# Patient Record
Sex: Female | Born: 1965 | Race: Black or African American | Hispanic: No | Marital: Married | Smoking: Never smoker
Health system: Southern US, Community
[De-identification: ages and names within clinical notes are randomized; demographics above are authoritative.]

## PROBLEM LIST (undated history)

## (undated) DIAGNOSIS — K649 Unspecified hemorrhoids: Secondary | ICD-10-CM

## (undated) DIAGNOSIS — M1990 Unspecified osteoarthritis, unspecified site: Secondary | ICD-10-CM

## (undated) DIAGNOSIS — E785 Hyperlipidemia, unspecified: Secondary | ICD-10-CM

## (undated) DIAGNOSIS — T7840XA Allergy, unspecified, initial encounter: Secondary | ICD-10-CM

## (undated) HISTORY — DX: Allergy, unspecified, initial encounter: T78.40XA

## (undated) HISTORY — DX: Unspecified osteoarthritis, unspecified site: M19.90

## (undated) HISTORY — DX: Hyperlipidemia, unspecified: E78.5

## (undated) HISTORY — DX: Unspecified hemorrhoids: K64.9

---

## 2005-11-18 HISTORY — PX: BREAST SURGERY: SHX581

## 2006-11-18 HISTORY — PX: KNEE SURGERY: SHX244

## 2015-09-28 ENCOUNTER — Encounter: Payer: Self-pay | Admitting: Internal Medicine

## 2015-09-28 ENCOUNTER — Ambulatory Visit (INDEPENDENT_AMBULATORY_CARE_PROVIDER_SITE_OTHER): Payer: BLUE CROSS/BLUE SHIELD | Admitting: Internal Medicine

## 2015-09-28 VITALS — BP 116/78 | HR 82 | Temp 98.1°F | Ht 63.33 in | Wt 221.0 lb

## 2015-09-28 DIAGNOSIS — E785 Hyperlipidemia, unspecified: Secondary | ICD-10-CM | POA: Diagnosis not present

## 2015-09-28 DIAGNOSIS — R748 Abnormal levels of other serum enzymes: Secondary | ICD-10-CM | POA: Diagnosis not present

## 2015-09-28 DIAGNOSIS — Z23 Encounter for immunization: Secondary | ICD-10-CM | POA: Diagnosis not present

## 2015-09-28 DIAGNOSIS — R7989 Other specified abnormal findings of blood chemistry: Secondary | ICD-10-CM

## 2015-09-28 DIAGNOSIS — R7303 Prediabetes: Secondary | ICD-10-CM | POA: Diagnosis not present

## 2015-09-28 LAB — LIPID PANEL
CHOL/HDL RATIO: 5
CHOLESTEROL: 207 mg/dL — AB (ref 0–200)
HDL: 45.3 mg/dL (ref >39.00)
LDL CALC: 152 mg/dL — AB (ref 0–99)
NonHDL: 162.05
TRIGLYCERIDES: 49 mg/dL (ref 0.0–149.0)
VLDL: 9.8 mg/dL (ref 0.0–40.0)

## 2015-09-28 LAB — BASIC METABOLIC PANEL
BUN: 13 mg/dL (ref 6–23)
CHLORIDE: 105 meq/L (ref 96–112)
CO2: 26 meq/L (ref 19–32)
Calcium: 9.4 mg/dL (ref 8.4–10.5)
Creatinine, Ser: 1.07 mg/dL (ref 0.40–1.20)
GFR: 69.88 mL/min (ref >60.00)
GLUCOSE: 94 mg/dL (ref 70–99)
POTASSIUM: 4.1 meq/L (ref 3.5–5.1)
SODIUM: 138 meq/L (ref 135–145)

## 2015-09-28 LAB — HEMOGLOBIN A1C: Hgb A1c MFr Bld: 5.7 % (ref 4.6–6.5)

## 2015-09-28 NOTE — Assessment & Plan Note (Signed)
CMET and Lipid Profile today Discussed low fat diet She wants to try low fat diet and exercise before starting statin therapy

## 2015-09-28 NOTE — Addendum Note (Signed)
Addended by: Roena MaladyEVONTENNO, Omaya Nieland Y on: 09/28/2015 04:13 PM   Modules accepted: Orders

## 2015-09-28 NOTE — Progress Notes (Signed)
Pre visit review using our clinic review tool, if applicable. No additional management support is needed unless otherwise documented below in the visit note. 

## 2015-09-28 NOTE — Patient Instructions (Signed)

## 2015-09-28 NOTE — Assessment & Plan Note (Signed)
She will continue seeing a nutritionist Continue Adipex

## 2015-09-28 NOTE — Progress Notes (Signed)
HPI  Pt presents to the clinic today to establish care and for management of the conditions listed below. She has not had a PCP in many years but she has been seeing her GYN.  Flu: 07/2014, wants one today Tetanus: 2008 Pap Smear: 09/2015 Mammogram: 04/2015 Vision Screening: yearly Dentist: yearly  HLD: Her LDL was 161 on her most recent labs, 04/2015. She has been trying to consume a low fat diet. She is not on any cholesterol lowering medication.  Obesity: She is working with a Health and safety inspector. She is on Adipex but reports she has gained 16 lbs over the 3 months.   She does tell me that her creatinine was elevated on her most recent labs 04/2015. It was 1.18. She has no history of renal disease. She does not consume a lot of Ibuprofen.  Prediabetes: Her last A1C was 04/2015- she does not remember the value. She reports she was on Metformin but stopped it due upset stomach. Her brother had Type 1 DM. She denies increased thirst, frequent urination, blurred vision, or numbness and tingling in her hands and feet.  Past Medical History  Diagnosis Date  . Arthritis     Current Outpatient Prescriptions  Medication Sig Dispense Refill  . ibuprofen (ADVIL,MOTRIN) 800 MG tablet Take 1 tablet by mouth every 8 (eight) hours as needed.  0   No current facility-administered medications for this visit.    Allergies  Allergen Reactions  . Biaxin [Clarithromycin] Other (See Comments)    Severe abdominal cramping  . Penicillins Hives    Family History  Problem Relation Age of Onset  . Arthritis Mother   . Hyperlipidemia Mother   . Hypertension Mother   . Heart disease Father   . Breast cancer Paternal Aunt     Social History   Social History  . Marital Status: Married    Spouse Name: N/A  . Number of Children: N/A  . Years of Education: N/A   Occupational History  . Not on file.   Social History Main Topics  . Smoking status: Never Smoker   . Smokeless tobacco: Never Used  .  Alcohol Use: 0.0 oz/week    0 Standard drinks or equivalent per week     Comment: occasional  . Drug Use: Not on file  . Sexual Activity: Not on file   Other Topics Concern  . Not on file   Social History Narrative  . No narrative on file    ROS:  Constitutional: Pt reports weight gain. Denies fever, malaise, fatigue, headache or .  HEENT: Denies eye pain, eye redness, ear pain, ringing in the ears, wax buildup, runny nose, nasal congestion, bloody nose, or sore throat. Respiratory: Denies difficulty breathing, shortness of breath, cough or sputum production.   Cardiovascular: Denies chest pain, chest tightness, palpitations or swelling in the hands or feet.  Gastrointestinal: Denies abdominal pain, bloating, constipation, diarrhea or blood in the stool.  GU: Denies frequency, urgency, pain with urination, blood in urine, odor or discharge. Skin: Denies redness, rashes, lesions or ulcercations.  Neurological: Denies dizziness, difficulty with memory, difficulty with speech or problems with balance and coordination.  Psych: Denies anxiety, depression, SI/HI.  No other specific complaints in a complete review of systems (except as listed in HPI above).  PE:  BP 116/78 mmHg  Pulse 82  Temp(Src) 98.1 F (36.7 C) (Oral)  Ht 5' 3.33" (1.609 m)  Wt 221 lb (100.245 kg)  BMI 38.72 kg/m2  SpO2 99%  LMP 09/11/2015  Wt Readings from Last 3 Encounters:  09/28/15 221 lb (100.245 kg)    General: Appears her stated age, obese in NAD. Skin: Warm, dry and intact. No rashes, lesion or ulcerations noted. HEENT: Head: normal shape and size; Eyes: sclera white, no icterus, conjunctiva pink, PERRLA and EOMs intact;  Cardiovascular: Normal rate and rhythm. S1,S2 noted.  No murmur, rubs or gallops noted.  Pulmonary/Chest: Normal effort and positive vesicular breath sounds. No respiratory distress. No wheezes, rales or ronchi noted. Abdomen: Soft, nontender.  Neurological: Alert and oriented.   Psychiatric: Mood and affect normal. Behavior is normal. Judgment and thought content normal.     Assessment and Plan:  Elevated creatinine:  BMET today  RTC in 1 year or sooner if needed

## 2015-09-28 NOTE — Assessment & Plan Note (Signed)
A1C today Discussed low carb diet and exercise Flu shot today

## 2016-01-31 ENCOUNTER — Ambulatory Visit (INDEPENDENT_AMBULATORY_CARE_PROVIDER_SITE_OTHER): Payer: BLUE CROSS/BLUE SHIELD | Admitting: Internal Medicine

## 2016-01-31 ENCOUNTER — Encounter: Payer: Self-pay | Admitting: Internal Medicine

## 2016-01-31 VITALS — BP 118/76 | HR 79 | Temp 98.2°F | Wt 214.0 lb

## 2016-01-31 DIAGNOSIS — R05 Cough: Secondary | ICD-10-CM

## 2016-01-31 DIAGNOSIS — R058 Other specified cough: Secondary | ICD-10-CM

## 2016-01-31 MED ORDER — BENZONATATE 200 MG PO CAPS
200.0000 mg | ORAL_CAPSULE | Freq: Two times a day (BID) | ORAL | Status: DC | PRN
Start: 2016-01-31 — End: 2016-05-16

## 2016-01-31 MED ORDER — HYDROCOD POLST-CPM POLST ER 10-8 MG/5ML PO SUER: 5.0000 mL | Freq: Every evening | ORAL | Status: DC | PRN

## 2016-01-31 NOTE — Progress Notes (Signed)
HPI  Pt presents to the clinic today with c/o cough. This started 1 week ago. The cough is non productive but seems worse at night. It started out with nasal and chest congestion but that has improved. She denies fever, chills or body aches. She has tried Catering managerAlka Seltzer and cough syrup without much relief. She reports her son was diagnosed with the flu. She did get her flu shot.  Review of Systems      Past Medical History  Diagnosis Date  . Arthritis     Family History  Problem Relation Age of Onset  . Arthritis Mother   . Hyperlipidemia Mother   . Hypertension Mother   . Heart disease Father   . Breast cancer Paternal Aunt     Social History   Social History  . Marital Status: Married    Spouse Name: N/A  . Number of Children: N/A  . Years of Education: N/A   Occupational History  . Not on file.   Social History Main Topics  . Smoking status: Never Smoker   . Smokeless tobacco: Never Used  . Alcohol Use: 0.0 oz/week    0 Standard drinks or equivalent per week     Comment: occasional  . Drug Use: No  . Sexual Activity: Yes    Birth Control/ Protection: Surgical   Other Topics Concern  . Not on file   Social History Narrative    Allergies  Allergen Reactions  . Biaxin [Clarithromycin] Other (See Comments)    Severe abdominal cramping  . Penicillins Hives     Constitutional: Denies headache, fatigue, fever or  abrupt weight changes.  HEENT:  Denies eye redness, eye pain, pressure behind the eyes, facial pain, nasal congestion, ear pain, ringing in the ears, wax buildup, runny nose or sore throat. Respiratory: Positive cough. Denies difficulty breathing or shortness of breath.  Cardiovascular: Denies chest pain, chest tightness, palpitations or swelling in the hands or feet.   No other specific complaints in a complete review of systems (except as listed in HPI above).  Objective:   BP 118/76 mmHg  Pulse 79  Temp(Src) 98.2 F (36.8 C) (Oral)  Wt 214  lb (97.07 kg)  SpO2 98% Wt Readings from Last 3 Encounters:  01/31/16 214 lb (97.07 kg)  09/28/15 221 lb (100.245 kg)     General: Appears hers stated age, well developed, well nourished in NAD. HEENT: Head: normal shape and size, no sinus tenderness noted; Eyes: sclera white, no icterus, conjunctiva pink; Ears: Tm's gray and intact, normal light reflex; Nose: mucosa pink and moist, septum midline; Throat/Mouth: Teeth present, mucosa erythematous and moist, no exudate noted, no lesions or ulcerations noted.  Neck: No cervical lymphadenopathy.  Cardiovascular: Normal rate and rhythm. S1,S2 noted.  No murmur, rubs or gallops noted.  Pulmonary/Chest: Normal effort and positive vesicular breath sounds. No respiratory distress. No wheezes, rales or ronchi noted.      Assessment & Plan:   Post viral cough:  No indication for abx at this time No s/s of flu eRx for Tessalon Pearls TID prn RX for Tussionex at night for cough Work note provided  RTC as needed or if symptoms persist.

## 2016-01-31 NOTE — Patient Instructions (Signed)

## 2016-01-31 NOTE — Progress Notes (Signed)
Pre visit review using our clinic review tool, if applicable. No additional management support is needed unless otherwise documented below in the visit note. 

## 2016-02-16 ENCOUNTER — Ambulatory Visit: Payer: Self-pay | Admitting: Internal Medicine

## 2016-05-16 ENCOUNTER — Ambulatory Visit (INDEPENDENT_AMBULATORY_CARE_PROVIDER_SITE_OTHER): Payer: BLUE CROSS/BLUE SHIELD | Admitting: Internal Medicine

## 2016-05-16 ENCOUNTER — Encounter: Payer: Self-pay | Admitting: Internal Medicine

## 2016-05-16 VITALS — BP 122/82 | HR 96 | Temp 98.7°F | Resp 18 | Wt 222.0 lb

## 2016-05-16 DIAGNOSIS — J0140 Acute pansinusitis, unspecified: Secondary | ICD-10-CM

## 2016-05-16 DIAGNOSIS — J019 Acute sinusitis, unspecified: Secondary | ICD-10-CM | POA: Insufficient documentation

## 2016-05-16 MED ORDER — HYDROCODONE-HOMATROPINE 5-1.5 MG/5ML PO SYRP: 5.0000 mL | ORAL_SOLUTION | Freq: Every evening | ORAL | Status: DC | PRN

## 2016-05-16 MED ORDER — SULFACETAMIDE SODIUM 10 % OP SOLN: 2.0000 [drp] | Freq: Four times a day (QID) | OPHTHALMIC | Status: DC

## 2016-05-16 MED ORDER — DOXYCYCLINE HYCLATE 100 MG PO TABS: 100.0000 mg | ORAL_TABLET | Freq: Two times a day (BID) | ORAL | Status: DC

## 2016-05-16 NOTE — Progress Notes (Signed)
   Subjective:    Patient ID: Amy Grimes, female    DOB: 02-05-1966, 50 y.o.   MRN: [id number]  HPI Here due to persistent respiratory illness  Started with sore throat x 5 days--then worsened Went to urgent care-- had exudates  Strep negative--just given lanacaine Throat still sore also  Cough persists and worse Dark yellow sputum--does have post nasal drip Occasional headache No fever or shakes--but some chills Not SOB No ear pain Voice is normal Right eye red this morning--irritated  Delsym for cough and advil for headache  Current Outpatient Prescriptions on File Prior to Visit  Medication Sig Dispense Refill  . ibuprofen (ADVIL,MOTRIN) 800 MG tablet Take 1 tablet by mouth every 8 (eight) hours as needed.  0  . Vitamin D, Ergocalciferol, (DRISDOL) 50000 units CAPS capsule Take 1 capsule by mouth once a week.  2   No current facility-administered medications on file prior to visit.    Allergies  Allergen Reactions  . Biaxin [Clarithromycin] Other (See Comments)    Severe abdominal cramping  . Penicillins Hives    Past Medical History  Diagnosis Date  . Arthritis     Past Surgical History  Procedure Laterality Date  . Breast surgery Left 2007  . Knee surgery Right 2008    Family History  Problem Relation Age of Onset  . Arthritis Mother   . Hyperlipidemia Mother   . Hypertension Mother   . Heart disease Father   . Breast cancer Paternal Aunt     Social History   Social History  . Marital Status: Married    Spouse Name: N/A  . Number of Children: N/A  . Years of Education: N/A   Occupational History  . Not on file.   Social History Main Topics  . Smoking status: Never Smoker   . Smokeless tobacco: Never Used  . Alcohol Use: 0.0 oz/week    0 Standard drinks or equivalent per week     Comment: occasional  . Drug Use: No  . Sexual Activity: Yes    Birth Control/ Protection: Surgical   Other Topics Concern  . Not on file   Social  History Narrative   Review of Systems No rash No diarrhea or vomiting Appetite is okay    Objective:   Physical Exam  Constitutional: She appears well-developed. No distress.  HENT:  No sinus tenderness TMs normal Mild nasal inflammation Tonsils 2+ with some exudate  Eyes:  Mild right conjunctival injection  Neck: Normal range of motion. Neck supple.  Pulmonary/Chest: Effort normal and breath sounds normal. No respiratory distress. She has no wheezes. She has no rales.  Lymphadenopathy:    She has no cervical adenopathy.          Assessment & Plan:

## 2016-05-16 NOTE — Assessment & Plan Note (Signed)
Exudative tonsillitis but now more cough with sputum and conjunctivitis ?atypical vs viral vs bacterial Will give cough med Doxy if worsens (and eye drops)

## 2016-05-16 NOTE — Progress Notes (Signed)
Pre visit review using our clinic review tool, if applicable. No additional management support is needed unless otherwise documented below in the visit note. 

## 2016-05-16 NOTE — Patient Instructions (Signed)
Fill the hydrocodone cough syrup now. If your cough and mucus worsens, start the doxycycline antibiotic (and the eye drops only if the red eye persists despite the antibiotic)

## 2016-11-15 ENCOUNTER — Encounter: Payer: Self-pay | Admitting: Internal Medicine

## 2016-11-15 ENCOUNTER — Ambulatory Visit (INDEPENDENT_AMBULATORY_CARE_PROVIDER_SITE_OTHER): Payer: BLUE CROSS/BLUE SHIELD | Admitting: Internal Medicine

## 2016-11-15 VITALS — BP 118/80 | HR 84 | Temp 97.9°F | Wt 232.0 lb

## 2016-11-15 DIAGNOSIS — R635 Abnormal weight gain: Secondary | ICD-10-CM | POA: Diagnosis not present

## 2016-11-15 DIAGNOSIS — N912 Amenorrhea, unspecified: Secondary | ICD-10-CM | POA: Diagnosis not present

## 2016-11-15 DIAGNOSIS — R4586 Emotional lability: Secondary | ICD-10-CM

## 2016-11-15 DIAGNOSIS — R238 Other skin changes: Secondary | ICD-10-CM

## 2016-11-15 DIAGNOSIS — R233 Spontaneous ecchymoses: Secondary | ICD-10-CM

## 2016-11-15 DIAGNOSIS — R5383 Other fatigue: Secondary | ICD-10-CM

## 2016-11-15 DIAGNOSIS — F39 Unspecified mood [affective] disorder: Secondary | ICD-10-CM

## 2016-11-15 LAB — CBC
HCT: 37.4 % (ref 36.0–46.0)
Hemoglobin: 12.4 g/dL (ref 12.0–15.0)
MCHC: 33.2 g/dL (ref 30.0–36.0)
MCV: 80.8 fl (ref 78.0–100.0)
Platelets: 369 K/uL (ref 150.0–400.0)
RBC: 4.63 Mil/uL (ref 3.87–5.11)
RDW: 14.6 % (ref 11.5–15.5)
WBC: 8.2 K/uL (ref 4.0–10.5)

## 2016-11-15 LAB — COMPREHENSIVE METABOLIC PANEL WITH GFR
ALT: 13 U/L (ref 0–35)
AST: 17 U/L (ref 0–37)
Albumin: 3.8 g/dL (ref 3.5–5.2)
Alkaline Phosphatase: 74 U/L (ref 39–117)
BUN: 17 mg/dL (ref 6–23)
CO2: 29 meq/L (ref 19–32)
Calcium: 9.1 mg/dL (ref 8.4–10.5)
Chloride: 105 meq/L (ref 96–112)
Creatinine, Ser: 1.02 mg/dL (ref 0.40–1.20)
GFR: 73.51 mL/min
Glucose, Bld: 85 mg/dL (ref 70–99)
Potassium: 4.3 meq/L (ref 3.5–5.1)
Sodium: 139 meq/L (ref 135–145)
Total Bilirubin: 0.3 mg/dL (ref 0.2–1.2)
Total Protein: 7.3 g/dL (ref 6.0–8.3)

## 2016-11-15 LAB — VITAMIN D 25 HYDROXY (VIT D DEFICIENCY, FRACTURES): VITD: 20.3 ng/mL — ABNORMAL LOW (ref 30.00–100.00)

## 2016-11-15 LAB — LUTEINIZING HORMONE: LH: 76.11 m[IU]/mL

## 2016-11-15 LAB — TSH: TSH: 2.13 u[IU]/mL (ref 0.35–4.50)

## 2016-11-15 LAB — VITAMIN B12: Vitamin B-12: 594 pg/mL (ref 211–911)

## 2016-11-15 LAB — FOLLICLE STIMULATING HORMONE: FSH: 43.2 m[IU]/mL

## 2016-11-15 NOTE — Patient Instructions (Signed)

## 2016-11-15 NOTE — Progress Notes (Signed)
Subjective:    Patient ID: Amy Grimes, female    DOB: 30-Jul-1966, 50 y.o.   MRN: [id number]  HPI  Pt presents to the clinic today with c/o fatigue, easy bruising and lack of her menstrual cycle over the last 2 months. She is sleeping about 6-7 hours of sleep at night. She does snore and feels tired throughout the day, but she does not nap. She does not have OSA that she is aware. She reports that the bruises pop up in different places at different times. She does not recall hitting herself on various objects. She does not take ASA but does take Ibuprofen on a daily basis. She reports her LMP was 09/10/2016. She has also noticed weight gain and mood swings.  Review of Systems      Past Medical History:  Diagnosis Date  . Arthritis     Current Outpatient Prescriptions  Medication Sig Dispense Refill  . ibuprofen (ADVIL,MOTRIN) 800 MG tablet Take 1 tablet by mouth every 8 (eight) hours as needed.  0  . phentermine (ADIPEX-P) 37.5 MG tablet Take 37.5 mg by mouth every morning.  0  . sulfacetamide (BLEPH-10) 10 % ophthalmic solution Place 2 drops into both eyes 4 (four) times daily. 15 mL 0   No current facility-administered medications for this visit.     Allergies  Allergen Reactions  . Biaxin [Clarithromycin] Other (See Comments)    Severe abdominal cramping  . Penicillins Hives    Family History  Problem Relation Age of Onset  . Arthritis Mother   . Hyperlipidemia Mother   . Hypertension Mother   . Heart disease Father   . Breast cancer Paternal Aunt     Social History   Social History  . Marital status: Married    Spouse name: N/A  . Number of children: N/A  . 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 Smoking status: Never Smoker  . Smokeless tobacco: Never Used  . Alcohol use 0.0 oz/week     Comment: occasional  . Drug use: No  . Sexual activity: Yes    Birth control/ protection: Surgical     Other Topics Concern  . Not on file   Social History Narrative  . No narrative on file     Constitutional: Pt reports fatigue and weight gain. Denies fever, malaise, headache.  Respiratory: Denies difficulty breathing, shortness of breath, cough or sputum production.   Cardiovascular: Denies chest pain, chest tightness, palpitations or swelling in the hands or feet.  Gastrointestinal: Denies abdominal pain, bloating, constipation, diarrhea or blood in the stool.  GU: Pt reports amenorrhea. Denies urgency, frequency, pain with urination, burning sensation, blood in urine, odor or discharge. Skin: Pt reports easy bruising. Denies redness, rashes, lesions or ulcercations.  Neurological: Denies dizziness, difficulty with memory, difficulty with speech or problems with balance and coordination.  Psych: Pt reports mood swings. Denies anxiety, depression, SI/HI.  No other specific complaints in a complete review of systems (except as listed in HPI above).  Objective:   Physical Exam   BP 118/80   Pulse 84   Temp 97.9 F (36.6 C) (Oral)   Wt 232 lb (105.2 kg)   LMP 09/10/2016   SpO2 99%   BMI 40.67 kg/m  Wt Readings from Last 3 Encounters:  11/15/16 232 lb (105.2 kg)  05/16/16 222 lb (100.7 kg)  01/31/16 214 lb (97.1 kg)  General: Appears her stated age, obese in NAD. Skin: Warm, dry and intact. Bruise noted on right lateral calf.  Abdomen: Soft and nontender. Normal bowel sounds. No distention or masses noted.  Pelvic: Deferred. Neurological: Alert and oriented.   Psychiatric: Mood and affect normal. Behavior is normal. Judgment and thought content normal.     BMET    Component Value Date/Time   NA 138 09/28/2015 1142   K 4.1 09/28/2015 1142   CL 105 09/28/2015 1142   CO2 26 09/28/2015 1142   GLUCOSE 94 09/28/2015 1142   BUN 13 09/28/2015 1142   CREATININE 1.07 09/28/2015 1142   CALCIUM 9.4 09/28/2015 1142    Lipid Panel     Component Value Date/Time    CHOL 207 (H) 09/28/2015 1142   TRIG 49.0 09/28/2015 1142   HDL 45.30 09/28/2015 1142   CHOLHDL 5 09/28/2015 1142   VLDL 9.8 09/28/2015 1142   LDLCALC 152 (H) 09/28/2015 1142    CBC No results found for: WBC, RBC, HGB, HCT, PLT, MCV, MCH, MCHC, RDW, LYMPHSABS, MONOABS, EOSABS, BASOSABS  Hgb A1C Lab Results  Component Value Date   HGBA1C 5.7 09/28/2015        Assessment & Plan:   Easy bruising:  Stop Ibuprofen for now CBC today  Fatigue, Amenorrhea, Weight Gain, Mood Swings:  Likely menopause related B12, Vit D, TSH, CMET, FSH, LH today Fatigue could be related to OSA, will send for sleep study if all labs are normal  Will follow up after labs, RTC as needed Nicki ReaperBAITY, Khadeem Rockett, NP

## 2016-11-20 ENCOUNTER — Telehealth: Payer: Self-pay | Admitting: *Deleted

## 2016-11-20 ENCOUNTER — Telehealth: Payer: Self-pay

## 2016-11-20 NOTE — Telephone Encounter (Signed)
She needs a physical, call and schedule her an appt

## 2016-11-20 NOTE — Telephone Encounter (Signed)
Pt returned your call Best number [phone]

## 2016-11-20 NOTE — Telephone Encounter (Signed)
PT sent the following message via MyChart: " Appointment Request From: Amy Grimes    With Provider: Nicki ReaperBAITY, REGINA, NP [[facility]    Preferred Date Range: From 11/18/2016 To 01/15/2017    Preferred Times: Any    Reason: To address the following health maintenance concerns.  Colonoscopy    Comments:   "  Please advise

## 2016-11-20 NOTE — Telephone Encounter (Signed)
Left detailed msg on VM per HIPAA Labs was released through mychart no result note was ever forwarded to me to know to call pt

## 2016-11-20 NOTE — Telephone Encounter (Signed)
Pt left v/m requesting cb about 11/15/16 lab results. Left v/m requesting cb.

## 2016-12-16 ENCOUNTER — Encounter: Payer: Self-pay | Admitting: Internal Medicine

## 2016-12-16 ENCOUNTER — Ambulatory Visit (INDEPENDENT_AMBULATORY_CARE_PROVIDER_SITE_OTHER): Payer: BLUE CROSS/BLUE SHIELD | Admitting: Internal Medicine

## 2016-12-16 VITALS — BP 110/60 | HR 80 | Temp 98.2°F | Ht 63.0 in | Wt 233.0 lb

## 2016-12-16 DIAGNOSIS — N951 Menopausal and female climacteric states: Secondary | ICD-10-CM | POA: Diagnosis not present

## 2016-12-16 DIAGNOSIS — Z0000 Encounter for general adult medical examination without abnormal findings: Secondary | ICD-10-CM

## 2016-12-16 DIAGNOSIS — Z23 Encounter for immunization: Secondary | ICD-10-CM | POA: Diagnosis not present

## 2016-12-16 DIAGNOSIS — Z1211 Encounter for screening for malignant neoplasm of colon: Secondary | ICD-10-CM

## 2016-12-16 LAB — CBC
HEMATOCRIT: 37.9 % (ref 36.0–46.0)
Hemoglobin: 12.4 g/dL (ref 12.0–15.0)
MCHC: 32.7 g/dL (ref 30.0–36.0)
MCV: 82.3 fl (ref 78.0–100.0)
PLATELETS: 435 10*3/uL — AB (ref 150.0–400.0)
RBC: 4.61 Mil/uL (ref 3.87–5.11)
RDW: 14.8 % (ref 11.5–15.5)
WBC: 8.3 10*3/uL (ref 4.0–10.5)

## 2016-12-16 LAB — LIPID PANEL
CHOL/HDL RATIO: 4
CHOLESTEROL: 206 mg/dL — AB (ref 0–200)
HDL: 53.3 mg/dL (ref >39.00)
LDL CALC: 138 mg/dL — AB (ref 0–99)
NonHDL: 152.88
TRIGLYCERIDES: 73 mg/dL (ref 0.0–149.0)
VLDL: 14.6 mg/dL (ref 0.0–40.0)

## 2016-12-16 LAB — COMPREHENSIVE METABOLIC PANEL
ALBUMIN: 3.9 g/dL (ref 3.5–5.2)
ALT: 14 U/L (ref 0–35)
AST: 17 U/L (ref 0–37)
Alkaline Phosphatase: 72 U/L (ref 39–117)
BUN: 16 mg/dL (ref 6–23)
CALCIUM: 9.1 mg/dL (ref 8.4–10.5)
CO2: 27 meq/L (ref 19–32)
CREATININE: 1.05 mg/dL (ref 0.40–1.20)
Chloride: 104 mEq/L (ref 96–112)
GFR: 71.07 mL/min (ref >60.00)
Glucose, Bld: 99 mg/dL (ref 70–99)
POTASSIUM: 3.9 meq/L (ref 3.5–5.1)
SODIUM: 137 meq/L (ref 135–145)
Total Bilirubin: 0.3 mg/dL (ref 0.2–1.2)
Total Protein: 7.7 g/dL (ref 6.0–8.3)

## 2016-12-16 LAB — HEMOGLOBIN A1C: Hgb A1c MFr Bld: 5.8 % (ref 4.6–6.5)

## 2016-12-16 MED ORDER — BUPROPION HCL ER (SR) 150 MG PO TB12
ORAL_TABLET | ORAL | 2 refills | Status: DC
Start: 2016-12-16 — End: 2017-02-05

## 2016-12-16 NOTE — Progress Notes (Signed)
Subjective:    Patient ID: Amy Grimes, female    DOB: [birth year], 51 y.o.   MRN: [id number]  HPI  Pt presents to the clinic today for her annual exam.   Flu: 09/2016 Tetanus: 2008 Pap Smear: 09/2015 at Dr. Wonda [facility], GYN Mammogram: 04/2016 Colon Screening: never Vision Screening: yearly Dentist: yearly  Diet:She does not eat meat during the week, only on the weekends. She consumes fruits and veggies daily. She tries to avoid fried foods. She drinks mostly water. Exercise: None  Review of Systems      Past Medical History:  Diagnosis Date  . Arthritis     No current outpatient prescriptions on file.   No current facility-administered medications for this visit.     Allergies  Allergen Reactions  . Biaxin [Clarithromycin] Other (See Comments)    Severe abdominal cramping  . Penicillins Hives    Family History  Problem Relation Age of Onset  . Arthritis Mother   . Hyperlipidemia Mother   . Hypertension Mother   . Heart disease Father   . Breast cancer Paternal Aunt     Social History   Social History  . Marital status: Married    Spouse name: N/A  . Number of children: N/A  . 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 Smoking status: Never Smoker  . Smokeless tobacco: Never Used  . Alcohol use 0.0 oz/week     Comment: occasional  . Drug use: No  . Sexual activity: Yes    Birth control/ protection: Surgical   Other Topics Concern  . Not on file   Social History Narrative  . No narrative on file     Constitutional: Pt reports weight gain. Denies fever, malaise, fatigue, headache.  HEENT: Denies eye pain, eye redness, ear pain, ringing in the ears, wax buildup, runny nose, nasal congestion, bloody nose, or sore throat. Respiratory: Denies difficulty breathing, shortness of breath, cough or sputum production.   Cardiovascular: Denies chest pain, chest tightness, palpitations or  swelling in the hands or feet.  Gastrointestinal: Pt reports intermittent constipation. Denies abdominal pain, bloating, diarrhea or blood in the stool.  GU: Pt reports irregular periods. Denies urgency, frequency, pain with urination, burning sensation, blood in urine, odor or discharge. Musculoskeletal: Pt reports bilateral knee pain. Denies decrease in range of motion, difficulty with gait, muscle pain or joint swelling.  Skin: Denies redness, rashes, lesions or ulcercations.  Neurological: Denies dizziness, difficulty with memory, difficulty with speech or problems with balance and coordination.  Psych: Pt reports mood swings. Denies anxiety, depression, SI/HI.  No other specific complaints in a complete review of systems (except as listed in HPI above).  Objective:   Physical Exam   BP 110/60   Pulse 80   Temp 98.2 F (36.8 C) (Oral)   Ht 5\' 3"  (1.6 m)   Wt 233 lb (105.7 kg)   LMP 11/29/2016   BMI 41.27 kg/m  Wt Readings from Last 3 Encounters:  12/16/16 233 lb (105.7 kg)  11/15/16 232 lb (105.2 kg)  05/16/16 222 lb (100.7 kg)    General: Appears her stated age, obese in NAD. Skin: Warm, dry and intact. No rashes, lesions or ulcerations noted. HEENT: Head: normal shape and size; Eyes: sclera white, no icterus, conjunctiva pink, PERRLA and EOMs intact; Ears: Tm's gray and intact, normal light reflex; Throat/Mouth: Teeth present, mucosa pink and moist, no exudate, lesions  or ulcerations noted.  Neck:  Neck supple, trachea midline. No masses, lumps or thyromegaly present.  Cardiovascular: Normal rate and rhythm. S1,S2 noted.  No murmur, rubs or gallops noted. Trace BLE edema. No carotid bruits noted. Pulmonary/Chest: Normal effort and positive vesicular breath sounds. No respiratory distress. No wheezes, rales or ronchi noted.  Abdomen: Soft and nontender. Normal bowel sounds. No distention or masses noted. Liver, spleen and kidneys non palpable. Musculoskeletal: Normal flexion  and extension of the knees. Crepitus noted with ROM. No signs of joint swelling. No difficulty with gait.  Neurological: Alert and oriented. Cranial nerves II-XII grossly intact. Coordination normal.  Psychiatric: Mood and affect normal. Behavior is normal. Judgment and thought content normal.     BMET    Component Value Date/Time   NA 139 11/15/2016 1432   K 4.3 11/15/2016 1432   CL 105 11/15/2016 1432   CO2 29 11/15/2016 1432   GLUCOSE 85 11/15/2016 1432   BUN 17 11/15/2016 1432   CREATININE 1.02 11/15/2016 1432   CALCIUM 9.1 11/15/2016 1432    Lipid Panel     Component Value Date/Time   CHOL 207 (H) 09/28/2015 1142   TRIG 49.0 09/28/2015 1142   HDL 45.30 09/28/2015 1142   CHOLHDL 5 09/28/2015 1142   VLDL 9.8 09/28/2015 1142   LDLCALC 152 (H) 09/28/2015 1142    CBC    Component Value Date/Time   WBC 8.2 11/15/2016 1432   RBC 4.63 11/15/2016 1432   HGB 12.4 11/15/2016 1432   HCT 37.4 11/15/2016 1432   PLT 369.0 11/15/2016 1432   MCV 80.8 11/15/2016 1432   MCHC 33.2 11/15/2016 1432   RDW 14.6 11/15/2016 1432    Hgb A1C Lab Results  Component Value Date   HGBA1C 5.7 09/28/2015           Assessment & Plan:   Preventative Health Maintenance:  Flu shot UTD Tetanus booster today Pap smear due 2019 Mammogram UTD- will request record Referral to GI for screening colonoscopy Encouraged her to consume a balanced diet and exercise regimen Advised her to see an eye doctor and dentist annually Will check CBC, CMET, Lipid and A1C today  Perimenopausal Symptoms:  She is really bothered by weight gain and mood swings Will trial Wellbutrin SR 150 mg BID She will follow up with me in 4 weeks via mychart  RTC in 1 year sooner if needed Nicki ReaperBAITY, Chisom Aust, NP

## 2016-12-16 NOTE — Addendum Note (Signed)
Addended by: Roena MaladyEVONTENNO, Karizma Cheek Y on: 12/16/2016 04:56 PM   Modules accepted: Orders

## 2016-12-16 NOTE — Patient Instructions (Signed)

## 2016-12-19 ENCOUNTER — Encounter: Payer: Self-pay | Admitting: Gastroenterology

## 2017-01-23 ENCOUNTER — Ambulatory Visit (AMBULATORY_SURGERY_CENTER): Payer: Self-pay | Admitting: *Deleted

## 2017-01-23 VITALS — Ht 63.5 in | Wt 240.0 lb

## 2017-01-23 DIAGNOSIS — Z1211 Encounter for screening for malignant neoplasm of colon: Secondary | ICD-10-CM

## 2017-01-23 MED ORDER — NA SULFATE-K SULFATE-MG SULF 17.5-3.13-1.6 GM/177ML PO SOLN: 1.0000 | Freq: Once | ORAL | 0 refills | Status: AC

## 2017-01-23 NOTE — Progress Notes (Signed)
No egg or soy allergy known to patient  No issues with past sedation with any surgeries  or procedures, no intubation problems  No diet pills per patient No home 02 use per patient  No blood thinners per patient  Pt denies issues with constipation  No A fib or A flutter  emmi video to e mail    

## 2017-01-28 ENCOUNTER — Encounter: Payer: Self-pay | Admitting: Gastroenterology

## 2017-02-05 ENCOUNTER — Ambulatory Visit (INDEPENDENT_AMBULATORY_CARE_PROVIDER_SITE_OTHER)
Admission: RE | Admit: 2017-02-05 | Discharge: 2017-02-05 | Disposition: A | Discharge status: Home / Self Care | Payer: BLUE CROSS/BLUE SHIELD | Source: Ambulatory Visit | Attending: Internal Medicine | Admitting: Internal Medicine

## 2017-02-05 ENCOUNTER — Ambulatory Visit (INDEPENDENT_AMBULATORY_CARE_PROVIDER_SITE_OTHER): Payer: BLUE CROSS/BLUE SHIELD | Admitting: Internal Medicine

## 2017-02-05 ENCOUNTER — Encounter: Payer: Self-pay | Admitting: Internal Medicine

## 2017-02-05 VITALS — BP 116/70 | HR 80 | Temp 98.5°F | Wt 239.8 lb

## 2017-02-05 DIAGNOSIS — M25562 Pain in left knee: Secondary | ICD-10-CM | POA: Diagnosis not present

## 2017-02-05 DIAGNOSIS — M545 Low back pain, unspecified: Secondary | ICD-10-CM

## 2017-02-05 DIAGNOSIS — M79671 Pain in right foot: Secondary | ICD-10-CM

## 2017-02-05 DIAGNOSIS — M25561 Pain in right knee: Secondary | ICD-10-CM | POA: Diagnosis not present

## 2017-02-05 NOTE — Patient Instructions (Signed)
Osteoarthritis  Osteoarthritis is a type of arthritis that affects tissue that covers the ends of bones in joints (cartilage). Cartilage acts as a cushion between the bones and helps them move smoothly. Osteoarthritis results when cartilage in the joints gets worn down. Osteoarthritis is sometimes called "wear and tear" arthritis.  Osteoarthritis is the most common form of arthritis. It often occurs in older people. It is a condition that gets worse over time (a progressive condition). Joints that are most often affected by this condition are in:  · Fingers.  · Toes.  · Hips.  · Knees.  · Spine, including neck and lower back.    What are the causes?  This condition is caused by age-related wearing down of cartilage that covers the ends of bones.  What increases the risk?  The following factors may make you more likely to develop this condition:  · Older age.  · Being overweight or obese.  · Overuse of joints, such as in athletes.  · Past injury of a joint.  · Past surgery on a joint.  · Family history of osteoarthritis.    What are the signs or symptoms?  The main symptoms of this condition are pain, swelling, and stiffness in the joint. The joint may lose its shape over time. Small pieces of bone or cartilage may break off and float inside of the joint, which may cause more pain and damage to the joint. Small deposits of bone (osteophytes) may grow on the edges of the joint. Other symptoms may include:  · A grating or scraping feeling inside the joint when you move it.  · Popping or creaking sounds when you move.    Symptoms may affect one or more joints. Osteoarthritis in a major joint, such as your knee or hip, can make it painful to walk or exercise. If you have osteoarthritis in your hands, you might not be able to grip items, twist your hand, or control small movements of your hands and fingers (fine motor skills).  How is this diagnosed?  This condition may be diagnosed based on:  · Your medical history.  · A  physical exam.  · Your symptoms.  · X-rays of the affected joint(s).  · Blood tests to rule out other types of arthritis.    How is this treated?  There is no cure for this condition, but treatment can help to control pain and improve joint function. Treatment plans may include:  · A prescribed exercise program that allows for rest and joint relief. You may work with a physical therapist.  · A weight control plan.  · Pain relief techniques, such as:  ? Applying heat and cold to the joint.  ? Electric pulses delivered to nerve endings under the skin (transcutaneous electrical nerve stimulation, or TENS).  ? Massage.  ? Certain nutritional supplements.  · NSAIDs or prescription medicines to help relieve pain.  · Medicine to help relieve pain and inflammation (corticosteroids). This can be given by mouth (orally) or as an injection.  · Assistive devices, such as a brace, wrap, splint, specialized glove, or cane.  · Surgery, such as:  ? An osteotomy. This is done to reposition the bones and relieve pain or to remove loose pieces of bone and cartilage.  ? Joint replacement surgery. You may need this surgery if you have very bad (advanced) osteoarthritis.    Follow these instructions at home:  Activity   · Rest your affected joints as directed by your   health care provider.  · Do not drive or use heavy machinery while taking prescription pain medicine.  · Exercise as directed. Your health care provider or physical therapist may recommend specific types of exercise, such as:  ? Strengthening exercises. These are done to strengthen the muscles that support joints that are affected by arthritis. They can be performed with weights or with exercise bands to add resistance.  ? Aerobic activities. These are exercises, such as brisk walking or water aerobics, that get your heart pumping.  ? Range-of-motion activities. These keep your joints easy to move.  ? Balance and agility exercises.  Managing pain, stiffness, and swelling    · If directed, apply heat to the affected area as often as told by your health care provider. Use the heat source that your health care provider recommends, such as a moist heat pack or a heating pad.  ? If you have a removable assistive device, remove it as told by your health care provider.  ? Place a towel between your skin and the heat source. If your health care provider tells you to keep the assistive device on while you apply heat, place a towel between the assistive device and the heat source.  ? Leave the heat on for 20-30 minutes.  ? Remove the heat if your skin turns bright red. This is especially important if you are unable to feel pain, heat, or cold. You may have a greater risk of getting burned.  · If directed, put ice on the affected joint:  ? If you have a removable assistive device, remove it as told by your health care provider.  ? Put ice in a plastic bag.  ? Place a towel between your skin and the bag. If your health care provider tells you to keep the assistive device on during icing, place a towel between the assistive device and the bag.  ? Leave the ice on for 20 minutes, 2-3 times a day.  General instructions   · Take over-the-counter and prescription medicines only as told by your health care provider.  · Maintain a healthy weight. Follow instructions from your health care provider for weight control. These may include dietary restrictions.  · Do not use any products that contain nicotine or tobacco, such as cigarettes and e-cigarettes. These can delay bone healing. If you need help quitting, ask your health care provider.  · Use assistive devices as directed by your health care provider.  · Keep all follow-up visits as told by your health care provider. This is important.  Where to find more information:  · National Institute of Arthritis and Musculoskeletal and Skin Diseases: www.niams.nih.gov  · National Institute on Aging: www.nia.nih.gov  · American College of Rheumatology:  www.rheumatology.org  Contact a health care provider if:  · Your skin turns red.  · You develop a rash.  · You have pain that gets worse.  · You have a fever along with joint or muscle aches.  Get help right away if:  · You lose a lot of weight.  · You suddenly lose your appetite.  · You have night sweats.  Summary  · Osteoarthritis is a type of arthritis that affects tissue covering the ends of bones in joints (cartilage).  · This condition is caused by age-related wearing down of cartilage that covers the ends of bones.  · The main symptom of this condition is pain, swelling, and stiffness in the joint.  · There is no cure for this   condition, but treatment can help to control pain and improve joint function.  This information is not intended to replace advice given to you by your health care provider. Make sure you discuss any questions you have with your health care provider.  Document Released: 11/04/2005 Document Revised: 07/08/2016 Document Reviewed: 07/08/2016  Elsevier Interactive Patient Education © 2017 [employer]vier Inc.

## 2017-02-05 NOTE — Progress Notes (Signed)
Subjective:    Patient ID: Amy Grimes, female    DOB: 1966-04-29, 51 y.o.   MRN: [id number]  HPI  Pt presents to the clinic today with c/o low back pain, and pain/swelling of her knees and feet. She reports this started about 1 month ago. She describes the pain as constant and achy. She has stiffness in her knees and feet. The right seems worse than the left. She denies numbness or tingling. Standing seems to make her pain worse. Her pain also seems to be worse in the morning. She denies any injury to her back, knees or feet. [place] She has also taken Ibuprofen and Tramadol with minimal relief. She is obese with a BMI of 41.80.   Review of Systems      Past Medical History:  Diagnosis Date  . Allergy   . Arthritis    knees  . Hemorrhoids   . Hyperlipidemia     Current Outpatient Prescriptions  Medication Sig Dispense Refill  . buPROPion (WELLBUTRIN SR) 150 MG 12 hr tablet Take 1 tab nightly x 3 nights, then increase to BID (Patient not taking: Reported on 01/23/2017) 60 tablet 2  . Cholecalciferol (VITAMIN D3) 5000 units CAPS Take 1 capsule by mouth daily.    Marland Kitchen. ibuprofen (ADVIL,MOTRIN) 100 MG/5ML suspension Take 200 mg by mouth as needed.    . traMADol (ULTRAM) 50 MG tablet Take 50 mg by mouth every 6 (six) hours as needed.     No current facility-administered medications for this visit.     Allergies  Allergen Reactions  . Biaxin [Clarithromycin] Other (See Comments)    Severe abdominal cramping  . Penicillins Hives    Family History  Problem Relation Age of Onset  . Arthritis Mother   . Hyperlipidemia Mother   . Hypertension Mother   . Heart disease Father   . Breast cancer Paternal Aunt   . Colon cancer Neg Hx   . Colon polyps Neg Hx   . Esophageal cancer Neg Hx   . Rectal cancer Neg Hx   . Stomach cancer Neg Hx     Social History   Social History  . Marital status: Married    Spouse name: N/A  .  Number of children: N/A  . 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 Smoking status: Never Smoker  . Smokeless tobacco: Never Used  . Alcohol use 0.0 oz/week     Comment: occasional  . Drug use: No  . Sexual activity: Yes    Birth control/ protection: Surgical   Other Topics Concern  . Not on file   Social History Narrative  . No narrative on file     Constitutional: Denies fever, malaise, fatigue, headache or abrupt weight changes.  Musculoskeletal: Pt reports back, knee and foot pain. Denies decrease in range of motionmuscle pain.   Neurological: Denies problems with balance and coordination.    No other specific complaints in a complete review of systems (except as listed in HPI above).  Objective:   Physical Exam  BP 116/70   Pulse 80   Temp 98.5 F (36.9 C) (Oral)   Wt 239 lb 12 oz (108.7 kg)   LMP 01/15/2017   BMI 41.80 kg/m  Wt Readings from Last 3 Encounters:  02/05/17 239 lb 12 oz (108.7 kg)  01/23/17 240 lb (108.9 kg)  12/16/16 233 lb (105.7 kg)    General: Appears her stated age, obese in NAD. Musculoskeletal: Normal flexion, extension, rotation and lateral bending of the spine. No bony tenderness noted over the spine. Normal flexion and extension of bilateral knees. Normal, flexion, extension, rotation, abduction and adduction of bilateral ankles. No joint swelling noted. No pain with palpation of the calf. Pain with palpation over the lateral metatarsals, right foot. No pain with palpation of achilles, heel or arch. She has pain in right foot with walking on heels and toes. Neurological: Alert and oriented. Sensation intact to BLE.   BMET    Component Value Date/Time   NA 137 12/16/2016 1522   K 3.9 12/16/2016 1522   CL 104 12/16/2016 1522   CO2 27 12/16/2016 1522   GLUCOSE 99 12/16/2016 1522   BUN 16 12/16/2016 1522   CREATININE 1.05 12/16/2016 1522   CALCIUM 9.1 12/16/2016 1522      Lipid Panel     Component Value Date/Time   CHOL 206 (H) 12/16/2016 1522   TRIG 73.0 12/16/2016 1522   HDL 53.30 12/16/2016 1522   CHOLHDL 4 12/16/2016 1522   VLDL 14.6 12/16/2016 1522   LDLCALC 138 (H) 12/16/2016 1522    CBC    Component Value Date/Time   WBC 8.3 12/16/2016 1522   RBC 4.61 12/16/2016 1522   HGB 12.4 12/16/2016 1522   HCT 37.9 12/16/2016 1522   PLT 435.0 (H) 12/16/2016 1522   MCV 82.3 12/16/2016 1522   MCHC 32.7 12/16/2016 1522   RDW 14.8 12/16/2016 1522    Hgb A1C Lab Results  Component Value Date   HGBA1C 5.8 12/16/2016            Assessment & Plan:   Low back pain, bilateral knee pain and right foot pain:  Will obtain xray of right foot, as this seems to be her biggest complaint She has known arthritis in her knees, likely in her back Her joint pain is complicated by her weight, discussed how weight loss could help Continue Ibuprofen and Tramadol for now.  If pain persist or worsens, we can have her evaluated by ortho  Will follow up after xray. RTC as needed Nicki Reaper, NP

## 2017-02-06 ENCOUNTER — Other Ambulatory Visit: Payer: Self-pay | Admitting: Internal Medicine

## 2017-02-06 ENCOUNTER — Encounter: Payer: Self-pay | Admitting: Gastroenterology

## 2017-02-06 ENCOUNTER — Ambulatory Visit (AMBULATORY_SURGERY_CENTER): Payer: BLUE CROSS/BLUE SHIELD | Admitting: Gastroenterology

## 2017-02-06 VITALS — BP 114/75 | HR 75 | Temp 96.6°F | Resp 16 | Ht 63.0 in | Wt 240.0 lb

## 2017-02-06 DIAGNOSIS — Z1212 Encounter for screening for malignant neoplasm of rectum: Secondary | ICD-10-CM

## 2017-02-06 DIAGNOSIS — Z1211 Encounter for screening for malignant neoplasm of colon: Secondary | ICD-10-CM

## 2017-02-06 MED ORDER — DICLOFENAC SODIUM 75 MG PO TBEC: 75.0000 mg | DELAYED_RELEASE_TABLET | Freq: Two times a day (BID) | ORAL | 2 refills | Status: DC

## 2017-02-06 MED ORDER — SODIUM CHLORIDE 0.9 % IV SOLN
500.0000 mL | INTRAVENOUS | Status: DC
Start: 2017-02-06 — End: 2018-06-05

## 2017-02-06 NOTE — Patient Instructions (Signed)
Impressions/recommendations:  Hemorrhoids (handout given)  YOU HAD AN ENDOSCOPIC PROCEDURE TODAY AT THE [facility]:   Refer to the procedure report that was given to you for any specific questions about what was found during the examination.  If the procedure report does not answer your questions, please call your gastroenterologist to clarify.  If you requested that your care partner not be given the details of your procedure findings, then the procedure report has been included in a sealed envelope for you to review at your convenience later.  YOU SHOULD EXPECT: Some feelings of bloating in the abdomen. Passage of more gas than usual.  Walking can help get rid of the air that was put into your GI tract during the procedure and reduce the bloating. If you had a lower endoscopy (such as a colonoscopy or flexible sigmoidoscopy) you may notice spotting of blood in your stool or on the toilet paper. If you underwent a bowel prep for your procedure, you may not have a normal bowel movement for a few days.  Please Note:  You might notice some irritation and congestion in your nose or some drainage.  This is from the oxygen used during your procedure.  There is no need for concern and it should clear up in a day or so.  SYMPTOMS TO REPORT IMMEDIATELY:   Following lower endoscopy (colonoscopy or flexible sigmoidoscopy):  Excessive amounts of blood in the stool  Significant tenderness or worsening of abdominal pains  Swelling of the abdomen that is new, acute  Fever of 100F or higher   For urgent or emergent issues, a gastroenterologist can be reached at any hour by calling [phone].   DIET:  We do recommend a small meal at first, but then you may proceed to your regular diet.  Drink plenty of fluids but you should avoid alcoholic beverages for 24 hours.  ACTIVITY:  You should plan to take it easy for the rest of today and you should NOT DRIVE or use heavy machinery until  tomorrow (because of the sedation medicines used during the test).    FOLLOW UP: Our staff will call the number listed on your records the next business day following your procedure to check on you and address any questions or concerns that you may have regarding the information given to you following your procedure. If we do not reach you, we will leave a message.  However, if you are feeling well and you are not experiencing any problems, there is no need to return our call.  We will assume that you have returned to your regular daily activities without incident.  If any biopsies were taken you will be contacted by phone or by letter within the next 1-3 weeks.  Please call us at [phone] if you have not heard about the biopsies in 3 weeks.    SIGNATURES/CONFIDENTIALITY: You and/or your care partner have signed paperwork which will be entered into your electronic medical record.  These signatures attest to the fact that that the information above on your After Visit Summary has been reviewed and is understood.  Full responsibility of the confidentiality of this discharge information lies with you and/or your care-partner. 

## 2017-02-06 NOTE — Op Note (Signed)
[facility] Patient Name: Amy Grimes Procedure Date: 02/06/2017 10:56 AM MRN: [id number] Endoscopist: Sherilyn Cooter L. Myrtie Neither , MD Age: 51 Referring MD:  Date of Birth: Nov 02, 1966 Gender: Female Account #: [id number] Procedure:                Colonoscopy Indications:              Screening for colorectal malignant neoplasm, This                            is the patient's first colonoscopy Medicines:                Monitored Anesthesia Care Procedure:                Pre-Anesthesia Assessment:                           - Prior to the procedure, a History and Physical                            was performed, and patient medications and                            allergies were reviewed. The patient's tolerance of                            previous anesthesia was also reviewed. The risks                            and benefits of the procedure and the sedation                            options and risks were discussed with the patient.                            All questions were answered, and informed consent                            was obtained. Prior Anticoagulants: The patient has                            taken no previous anticoagulant or antiplatelet                            agents. ASA Grade Assessment: I - A normal, healthy                            patient. After reviewing the risks and benefits,                            the patient was deemed in satisfactory condition to                            undergo the procedure.  After obtaining informed consent, the colonoscope                            was passed under direct vision. Throughout the                            procedure, the patient's blood pressure, pulse, and                            oxygen saturations were monitored continuously. The                            Colonoscope was introduced through the anus and                            advanced to the the cecum, identified  by                            appendiceal orifice and ileocecal valve. The                            colonoscopy was performed without difficulty. The                            patient tolerated the procedure well. The quality                            of the bowel preparation was excellent. The                            ileocecal valve, appendiceal orifice, and rectum                            were photographed. The quality of the bowel                            preparation was evaluated using the BBPS [facility]                            Bowel Preparation Scale) with scores of: Right                            Colon = 3, Transverse Colon = 3 and Left Colon = 3                            (entire mucosa seen well with no residual staining,                            small fragments of stool or opaque liquid). The                            total BBPS score equals 9. The bowel preparation  used was SUPREP. Scope In: 11:07:03 AM Scope Out: 11:19:46 AM Scope Withdrawal Time: 0 hours 8 minutes 17 seconds  Total Procedure Duration: 0 hours 12 minutes 43 seconds  Findings:                 The perianal and digital rectal examinations were                            normal.                           Internal hemorrhoids were found during retroflexion                            and during anoscopy. The hemorrhoids were small and                            Grade I (internal hemorrhoids that do not prolapse).                           The exam was otherwise without abnormality on                            direct and retroflexion views. Complications:            No immediate complications. Estimated Blood Loss:     Estimated blood loss: none. Impression:               - Internal hemorrhoids.                           - The examination was otherwise normal on direct                            and retroflexion views.                           - No specimens  collected. Recommendation:           - Patient has a contact number available for                            emergencies. The signs and symptoms of potential                            delayed complications were discussed with the                            patient. Return to normal activities tomorrow.                            Written discharge instructions were provided to the                            patient.                           - Resume previous diet.                           -  Continue present medications.                           - Repeat colonoscopy in 10 years for screening                            purposes. Maci Eickholt L. Myrtie Neither, MD 02/06/2017 11:25:12 AM This report has been signed electronically.

## 2017-02-06 NOTE — Progress Notes (Signed)
To PACU, vss patent aw report to rn 

## 2017-02-07 ENCOUNTER — Telehealth: Payer: Self-pay | Admitting: *Deleted

## 2017-02-07 NOTE — Telephone Encounter (Signed)
  Follow up Call-  Call back number 02/06/2017  Post procedure Call Back phone  # [phone]  Permission to leave phone message Yes     Patient questions:  Do you have a fever, pain , or abdominal swelling? No. Pain Score  0 *  Have you tolerated food without any problems? Yes.    Have you been able to return to your normal activities? Yes.    Do you have any questions about your discharge instructions: Diet   No. Medications  No. Follow up visit  No.  Do you have questions or concerns about your Care? No.  Actions: * If pain score is 4 or above: No action needed, pain <4.

## 2017-03-20 ENCOUNTER — Ambulatory Visit (INDEPENDENT_AMBULATORY_CARE_PROVIDER_SITE_OTHER): Payer: BLUE CROSS/BLUE SHIELD | Admitting: Internal Medicine

## 2017-03-20 ENCOUNTER — Encounter: Payer: Self-pay | Admitting: Internal Medicine

## 2017-03-20 VITALS — BP 108/64 | HR 82 | Temp 98.0°F | Wt 235.0 lb

## 2017-03-20 DIAGNOSIS — N898 Other specified noninflammatory disorders of vagina: Secondary | ICD-10-CM | POA: Diagnosis not present

## 2017-03-20 NOTE — Addendum Note (Signed)
Addended by: Alvina ChouWALSH, TERRI J on: 03/20/2017 04:44 PM   Modules accepted: Orders

## 2017-03-20 NOTE — Patient Instructions (Signed)
Cervicitis Cervicitis is when the cervix gets irritated and swollen. Your cervix is the lower end of your uterus. Follow these instructions at home:  Do not have sex until your doctor says it is okay.  Take over-the-counter and prescription medicines only as told by your doctor.  If you were prescribed an antibiotic medicine, take it as told by your doctor. Do not stop taking it even if you start to feel better.  Keep all follow-up visits as told by your doctor. This is important. Contact a doctor if:  Your symptoms come back after treatment.  Your symptoms get worse after treatment.  You have a fever.  You feel tired (fatigued).  Your belly (abdomen) hurts.  You feel like you are going to throw up (are nauseous).  You throw up (vomit).  You have watery poop (diarrhea).  Your back hurts. Get help right away if:  You have very bad pain in your belly, and medicine does not help it.  You cannot pee (urinate). Summary  Cervicitis is when the cervix gets irritated and swollen.  Do not have sex until your doctor says it is okay.  If you need to take an antibiotic, do not stop taking even if you start to feel better. Take medicines only as told by your doctor. This information is not intended to replace advice given to you by your health care provider. Make sure you discuss any questions you have with your health care provider. Document Released: 08/13/2008 Document Revised: 07/21/2016 Document Reviewed: 07/21/2016 Elsevier Interactive Patient Education  2017 [employer] Inc.  

## 2017-03-20 NOTE — Progress Notes (Signed)
Subjective:    Patient ID: Amy Grimes, female    DOB: [birth year], 51 y.o.   MRN: [id number]  HPI  Pt presents to the clinic today with c/o vaginal discharge and odor. This started 1 week ago. The discharge is yellow in color. She can not described the odor. She denies vaginal itching, irritation, bumps or lesion. She denies abnormal uterine bleeding. She denies fever, chills, body aches or pelvic pain. She has not taken anything OTC for this. She has no known exposures to an STD. She is sexually active.  Review of Systems      Past Medical History:  Diagnosis Date  . Allergy   . Arthritis    knees  . Hemorrhoids   . Hyperlipidemia     Current Outpatient Prescriptions  Medication Sig Dispense Refill  . Cholecalciferol (VITAMIN D3) 5000 units CAPS Take 1 capsule by mouth daily.    . diclofenac (VOLTAREN) 75 MG EC tablet Take 1 tablet (75 mg total) by mouth 2 (two) times daily. (Patient not taking: Reported on 02/06/2017) 60 tablet 2  . ibuprofen (ADVIL,MOTRIN) 100 MG/5ML suspension Take 200 mg by mouth as needed.    . traMADol (ULTRAM) 50 MG tablet Take 50 mg by mouth every 6 (six) hours as needed.     Current Facility-Administered Medications  Medication Dose Route Frequency Provider Last Rate Last Dose  . 0.9 %  sodium chloride infusion  500 mL Intravenous Continuous Charlie PitterHenry L Danis III, MD        Allergies  Allergen Reactions  . Biaxin [Clarithromycin] Other (See Comments)    Severe abdominal cramping  . Penicillins Hives    Family History  Problem Relation Age of Onset  . Arthritis Mother   . Hyperlipidemia Mother   . Hypertension Mother   . Heart disease Father   . Breast cancer Paternal Aunt   . Colon cancer Neg Hx   . Colon polyps Neg Hx   . Esophageal cancer Neg Hx   . Rectal cancer Neg Hx   . Stomach cancer Neg Hx     Social History   Social History  . Marital status: Married    Spouse name: N/A  . Number of children: N/A  . Years of education:  N/A   Occupational History  . 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 Smoking status: Never Smoker  . Smokeless tobacco: Never Used  . Alcohol use 0.0 oz/week     Comment: occasional  . Drug use: No  . Sexual activity: Yes    Birth control/ protection: Surgical   Other Topics Concern  . Not on file   Social History Narrative  . No narrative on file     Constitutional: Denies fever, malaise, fatigue, headache or abrupt weight changes.  Gastrointestinal: Denies abdominal pain, bloating, constipation, diarrhea or blood in the stool.  GU: Pt reports vaginal discharge and odor. Denies urgency, frequency, pain with urination, burning sensation, blood in urine   No other specific complaints in a complete review of systems (except as listed in HPI above).  Objective:   Physical Exam   BP 108/64   Pulse 82   Temp 98 F (36.7 C) (Oral)   Wt 235 lb (106.6 kg)   SpO2 99%   BMI 41.63 kg/m  Wt Readings from Last 3 Encounters:  03/20/17 235 lb (106.6 kg)  02/06/17 240 lb (108.9 kg)  02/05/17 239 lb 12 oz (108.7 kg)  General: Appears her stated age,  in NAD. Abdomen: Soft and nontender. Pelvic: Self swabbed.  BMET    Component Value Date/Time   NA 137 12/16/2016 1522   K 3.9 12/16/2016 1522   CL 104 12/16/2016 1522   CO2 27 12/16/2016 1522   GLUCOSE 99 12/16/2016 1522   BUN 16 12/16/2016 1522   CREATININE 1.05 12/16/2016 1522   CALCIUM 9.1 12/16/2016 1522    Lipid Panel     Component Value Date/Time   CHOL 206 (H) 12/16/2016 1522   TRIG 73.0 12/16/2016 1522   HDL 53.30 12/16/2016 1522   CHOLHDL 4 12/16/2016 1522   VLDL 14.6 12/16/2016 1522   LDLCALC 138 (H) 12/16/2016 1522    CBC    Component Value Date/Time   WBC 8.3 12/16/2016 1522   RBC 4.61 12/16/2016 1522   HGB 12.4 12/16/2016 1522   HCT 37.9 12/16/2016 1522   PLT 435.0 (H) 12/16/2016 1522   MCV 82.3 12/16/2016 1522   MCHC 32.7 12/16/2016 1522   RDW 14.8 12/16/2016 1522     Hgb A1C Lab Results  Component Value Date   HGBA1C 5.8 12/16/2016          Assessment & Plan:   Vaginal Discharge and Odor:  Urine G/C gen probe Wet prep sent off  Will follow up with after labs are back and let you know appropriate treatment if any, RTC as needed Nicki Reaper, NP

## 2017-03-21 ENCOUNTER — Other Ambulatory Visit: Payer: Self-pay | Admitting: Internal Medicine

## 2017-03-21 LAB — WET PREP BY MOLECULAR PROBE
CANDIDA SPECIES: NOT DETECTED
Gardnerella vaginalis: DETECTED — AB
TRICHOMONAS VAG: DETECTED — AB

## 2017-03-21 LAB — GC/CHLAMYDIA PROBE AMP
CT Probe RNA: NOT DETECTED
GC PROBE AMP APTIMA: NOT DETECTED

## 2017-03-21 MED ORDER — METRONIDAZOLE 500 MG PO TABS: 500.0000 mg | ORAL_TABLET | Freq: Two times a day (BID) | ORAL | 0 refills | Status: DC

## 2017-07-10 ENCOUNTER — Encounter: Payer: Self-pay | Admitting: Internal Medicine

## 2017-07-10 ENCOUNTER — Ambulatory Visit (INDEPENDENT_AMBULATORY_CARE_PROVIDER_SITE_OTHER): Payer: BLUE CROSS/BLUE SHIELD | Admitting: Internal Medicine

## 2017-07-10 VITALS — BP 122/76 | HR 65 | Temp 98.1°F | Wt 235.2 lb

## 2017-07-10 DIAGNOSIS — M25562 Pain in left knee: Secondary | ICD-10-CM

## 2017-07-10 DIAGNOSIS — G8929 Other chronic pain: Secondary | ICD-10-CM

## 2017-07-10 DIAGNOSIS — M25561 Pain in right knee: Secondary | ICD-10-CM

## 2017-07-10 DIAGNOSIS — M79671 Pain in right foot: Secondary | ICD-10-CM

## 2017-07-10 DIAGNOSIS — M79604 Pain in right leg: Secondary | ICD-10-CM | POA: Diagnosis not present

## 2017-07-10 DIAGNOSIS — M545 Low back pain, unspecified: Secondary | ICD-10-CM

## 2017-07-10 MED ORDER — DICLOFENAC SODIUM 75 MG PO TBEC: 75.0000 mg | DELAYED_RELEASE_TABLET | Freq: Two times a day (BID) | ORAL | 2 refills | Status: DC

## 2017-07-10 MED ORDER — METHOCARBAMOL 500 MG PO TABS: 500.0000 mg | ORAL_TABLET | Freq: Every evening | ORAL | 0 refills | Status: DC | PRN

## 2017-07-10 MED ORDER — PREDNISONE 10 MG PO TABS: ORAL_TABLET | ORAL | 0 refills | Status: DC

## 2017-07-10 NOTE — Patient Instructions (Signed)

## 2017-07-10 NOTE — Progress Notes (Signed)
Subjective:    Patient ID: Amy Grimes, female    DOB: 14-Jun-1966, 51 y.o.   MRN: [id number]  HPI  Pt presents to the clinic today with c/o low back pain. She reports this started about 1 month ago. She describes the pain as sore and achy. The pain does not radiate. The pain seems worse after laying down or sitting for long periods of time. She denies issues with bowel or bladder. She denies any injury to the area. She has been taking Diclofenac with some relief.  She also c/o right leg pain. This started about 1 month ago as well. The pain is located on the right side of her calf. She describes the pain as tightness. She denies swelling, redness or warmth. The pain is worse first thing in the morning, better throughout the day. She denies any injury to the area, but reports she has had an arthroscopic surgery on her right knee for a torn meniscus a few years ago. She has tried Diclofenac with some relief.   She also c/o bilateral knee pain and right foot pain. She has been seeing [place] for this. She reports she recently had a steroid injection in both of her knees, which provided some relief. She reports the orthopedic told her she had plantar fasciitis. She was given inserts to wear in her shoes but she reports that did not help. She would like to transition her orthopedic care to [place].  Review of Systems      Past Medical History:  Diagnosis Date  . Allergy   . Arthritis    knees  . Hemorrhoids   . Hyperlipidemia     Current Outpatient Prescriptions  Medication Sig Dispense Refill  . Cholecalciferol (VITAMIN D3) 5000 units CAPS Take 1 capsule by mouth daily.    . diclofenac (VOLTAREN) 75 MG EC tablet Take 1 tablet (75 mg total) by mouth 2 (two) times daily. 60 tablet 2  . ibuprofen (ADVIL,MOTRIN) 100 MG/5ML suspension Take 200 mg by mouth as needed.    . metroNIDAZOLE (FLAGYL) 500 MG tablet Take 1 tablet (500 mg total) by mouth 2 (two) times daily. 14 tablet 0   . traMADol (ULTRAM) 50 MG tablet Take 50 mg by mouth every 6 (six) hours as needed.     Current Facility-Administered Medications  Medication Dose Route Frequency Provider Last Rate Last Dose  . 0.9 %  sodium chloride infusion  500 mL Intravenous Continuous Sherrilyn Rist, MD        Allergies  Allergen Reactions  . Biaxin [Clarithromycin] Other (See Comments)    Severe abdominal cramping  . Penicillins Hives    Family History  Problem Relation Age of Onset  . Arthritis Mother   . Hyperlipidemia Mother   . Hypertension Mother   . Heart disease Father   . Breast cancer Paternal Aunt   . Colon cancer Neg Hx   . Colon polyps Neg Hx   . Esophageal cancer Neg Hx   . Rectal cancer Neg Hx   . Stomach cancer Neg Hx     Social History   Social History  . Marital status: Married    Spouse name: N/A  . Number of children: N/A  . 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 Smoking status: Never Smoker  . Smokeless tobacco: Never Used  . Alcohol use 0.0 oz/week  Comment: occasional  . Drug use: No  . Sexual activity: Yes    Birth control/ protection: Surgical   Other Topics Concern  . Not on file   Social History Narrative  . No narrative on file     Constitutional: Denies fever, malaise, fatigue, headache or abrupt weight changes.  Musculoskeletal: Pt reports low back pain, right leg pain, bilateral knee pain and right foot pain. . Denies decrease in range of motion, difficulty with gait, or joint swelling.    No other specific complaints in a complete review of systems (except as listed in HPI above).  Objective:   Physical Exam  BP 122/76 (BP Location: Right Arm, Patient Position: Sitting, Cuff Size: Large)   Pulse 65   Temp 98.1 F (36.7 C) (Oral)   Wt 235 lb 4 oz (106.7 kg)   LMP 06/25/2017   SpO2 98%   BMI 41.67 kg/m  Wt Readings from Last 3 Encounters:  07/10/17 235 lb 4 oz (106.7  kg)  03/20/17 235 lb (106.6 kg)  02/06/17 240 lb (108.9 kg)    General: Appears her stated age, obese in NAD. Musculoskeletal: Normal flexion, extension, rotation and lateral bending. No bony tenderness noted over the spine. Pain with palpation of the right paralumbar muscles. No pain with palpation of the right hip. Normal flexion and extension of the right knee, but crepitus noted with ROM. No joint swelling noted. She has pain with palpation of the right lateral calf. She has difficulty walking on her right heel and tip toes. She limps with normal gait. Neurological: Alert and oriented. Sensation intact to BLE.   BMET    Component Value Date/Time   NA 137 12/16/2016 1522   K 3.9 12/16/2016 1522   CL 104 12/16/2016 1522   CO2 27 12/16/2016 1522   GLUCOSE 99 12/16/2016 1522   BUN 16 12/16/2016 1522   CREATININE 1.05 12/16/2016 1522   CALCIUM 9.1 12/16/2016 1522    Lipid Panel     Component Value Date/Time   CHOL 206 (H) 12/16/2016 1522   TRIG 73.0 12/16/2016 1522   HDL 53.30 12/16/2016 1522   CHOLHDL 4 12/16/2016 1522   VLDL 14.6 12/16/2016 1522   LDLCALC 138 (H) 12/16/2016 1522    CBC    Component Value Date/Time   WBC 8.3 12/16/2016 1522   RBC 4.61 12/16/2016 1522   HGB 12.4 12/16/2016 1522   HCT 37.9 12/16/2016 1522   PLT 435.0 (H) 12/16/2016 1522   MCV 82.3 12/16/2016 1522   MCHC 32.7 12/16/2016 1522   RDW 14.8 12/16/2016 1522    Hgb A1C Lab Results  Component Value Date   HGBA1C 5.8 12/16/2016            Assessment & Plan:   Low Back Pain:  Seems more muscular than anything Discussed how core strengthening and weight loss could help improve her back pain Stretching exercises given Heat and massage may be helpful eRx for Methocarbamol 500 mg QHS prn  Right Leg Pain:  She reports her orthopedic told her this was tendonitis Hold Diclofenac eRx for Pred Taper x 9 days Avoid overuse PT may be beneficial for her back and leg pain, but we will  see how her Prednisone and Methocarbamol works first  Bilateral Knee Pain and Right Foot Pain:  She has been seeing ortho for the same, requesting a new ortho referral to day Hold Diclofenac for now, resume once Prednisone is completed  RTC in 6 months for your  annual exam Nicki ReaperBAITY, Winda Summerall, NP

## 2017-08-02 ENCOUNTER — Other Ambulatory Visit: Payer: Self-pay | Admitting: Internal Medicine

## 2017-08-02 DIAGNOSIS — M545 Low back pain, unspecified: Secondary | ICD-10-CM

## 2017-08-02 DIAGNOSIS — M79604 Pain in right leg: Secondary | ICD-10-CM

## 2017-08-04 NOTE — Telephone Encounter (Signed)
Last filled 07/10/17... Please advise

## 2017-08-22 ENCOUNTER — Encounter: Payer: Self-pay | Admitting: Internal Medicine

## 2018-06-05 ENCOUNTER — Ambulatory Visit: Payer: BLUE CROSS/BLUE SHIELD | Admitting: Internal Medicine

## 2018-06-05 ENCOUNTER — Encounter: Payer: Self-pay | Admitting: Internal Medicine

## 2018-06-05 VITALS — BP 122/84 | HR 77 | Temp 98.1°F | Wt 235.0 lb

## 2018-06-05 DIAGNOSIS — G4701 Insomnia due to medical condition: Secondary | ICD-10-CM | POA: Diagnosis not present

## 2018-06-05 DIAGNOSIS — R232 Flushing: Secondary | ICD-10-CM

## 2018-06-05 DIAGNOSIS — N951 Menopausal and female climacteric states: Secondary | ICD-10-CM | POA: Diagnosis not present

## 2018-06-05 MED ORDER — VENLAFAXINE HCL ER 37.5 MG PO CP24: 37.5000 mg | ORAL_CAPSULE | Freq: Every day | ORAL | 2 refills | Status: DC

## 2018-06-05 NOTE — Progress Notes (Signed)
Subjective:    Patient ID: Amy Grimes, female    DOB: Oct 25, 1966, 52 y.o.   MRN: [id number]  HPI  Pt presents to the clinic today with c/o hot flashes and insomnia. This has been going on 1-2 months. The hot flashes are intermittent. She noticed drinking wine makes them more intense so she stopped that. She is having difficulty falling and staying asleep. She is averaging 3-4 hours of sleep per night. Her LMP was 12/2017. We checked her LH/FSH last year- consistent with perimenopause. She has tried [employer] and Melatonin with minimal relief.   Review of Systems      Past Medical History:  Diagnosis Date  . Allergy   . Arthritis    knees  . Hemorrhoids   . Hyperlipidemia     Current Outpatient Medications  Medication Sig Dispense Refill  . Cholecalciferol (VITAMIN D3) 5000 units CAPS Take 1 capsule by mouth daily.    . diclofenac (VOLTAREN) 75 MG EC tablet Take 1 tablet (75 mg total) by mouth 2 (two) times daily. 60 tablet 2  . ibuprofen (ADVIL,MOTRIN) 100 MG/5ML suspension Take 200 mg by mouth as needed.    . methocarbamol (ROBAXIN) 500 MG tablet TAKE 1 TABLET (500 MG TOTAL) BY MOUTH AT BEDTIME AS NEEDED FOR MUSCLE SPASMS. 20 tablet 0  . predniSONE (DELTASONE) 10 MG tablet Take 3 tabs on days 1-3, take 2 tabs on days 4-6, take 1 tab on days 7-9 18 tablet 0   Current Facility-Administered Medications  Medication Dose Route Frequency Provider Last Rate Last Dose  . 0.9 %  sodium chloride infusion  500 mL Intravenous Continuous Sherrilyn Rist, MD        Allergies  Allergen Reactions  . Biaxin [Clarithromycin] Other (See Comments)    Severe abdominal cramping  . Penicillins Hives    Family History  Problem Relation Age of Onset  . Arthritis Mother   . Hyperlipidemia Mother   . Hypertension Mother   . Heart disease Father   . Breast cancer Paternal Aunt   . Colon cancer Neg Hx   . Colon polyps Neg Hx   . Esophageal cancer Neg Hx   . Rectal cancer Neg Hx   .  Stomach cancer Neg Hx     Social History   Socioeconomic History  . Marital status: Married    Spouse name: Not on file  . Number of children: Not on file  . Years of education: Not on file  . Highest education level: Not on file  Occupational History  . Occupation: Psychologist, sport and exercise: [employer]  Social Needs  . Financial resource strain: Not on file  . Food insecurity:    Worry: Not on file    Inability: Not on file  . Transportation needs:    Medical: Not on file    Non-medical: Not on file  Tobacco Use  . Smoking status: Never Smoker  . Smokeless tobacco: Never Used  Substance and Sexual Activity  . Alcohol use: Yes    Alcohol/week: 0.0 oz    Comment: occasional  . Drug use: No  . Sexual activity: Yes    Birth control/protection: Surgical  Lifestyle  . Physical activity:    Days per week: Not on file    Minutes per session: Not on file  . Stress: Not on file  Relationships  . Social connections:    Talks on phone: Not on file    Gets together:  Not on file    Attends religious service: Not on file    Active member of club or organization: Not on file    Attends meetings of clubs or organizations: Not on file    Relationship status: Not on file  . Intimate partner violence:    Fear of current or ex partner: Not on file    Emotionally abused: Not on file    Physically abused: Not on file    Forced sexual activity: Not on file  Other Topics Concern  . Not on file  Social History Narrative  . Not on file     Constitutional: Denies fever, malaise, fatigue, headache or abrupt weight changes.  Respiratory: Denies difficulty breathing, shortness of breath, cough or sputum production.   Cardiovascular: Denies chest pain, chest tightness, palpitations or swelling in the hands or feet.  Neurological: Pt reports hot flashes and insomnia. Denies dizziness, difficulty with memory, difficulty with speech or problems with balance and coordination.  Psych: Denies  anxiety, depression, SI/HI.  No other specific complaints in a complete review of systems (except as listed in HPI above).  Objective:   Physical Exam   BP 122/84   Pulse 77   Temp 98.1 F (36.7 C) (Oral)   Wt 235 lb (106.6 kg)   LMP 12/21/2017   SpO2 99%   BMI 41.63 kg/m  Wt Readings from Last 3 Encounters:  06/05/18 235 lb (106.6 kg)  07/10/17 235 lb 4 oz (106.7 kg)  03/20/17 235 lb (106.6 kg)    General: Appears her stated age, obese in NAD. Skin: Warm, dry and intact. Dark circles noted under eyes. Neurological: Alert and oriented.  Psychiatric: Mood and affect normal. Behavior is normal. Judgment and thought content normal.     BMET    Component Value Date/Time   NA 137 12/16/2016 1522   K 3.9 12/16/2016 1522   CL 104 12/16/2016 1522   CO2 27 12/16/2016 1522   GLUCOSE 99 12/16/2016 1522   BUN 16 12/16/2016 1522   CREATININE 1.05 12/16/2016 1522   CALCIUM 9.1 12/16/2016 1522    Lipid Panel     Component Value Date/Time   CHOL 206 (H) 12/16/2016 1522   TRIG 73.0 12/16/2016 1522   HDL 53.30 12/16/2016 1522   CHOLHDL 4 12/16/2016 1522   VLDL 14.6 12/16/2016 1522   LDLCALC 138 (H) 12/16/2016 1522    CBC    Component Value Date/Time   WBC 8.3 12/16/2016 1522   RBC 4.61 12/16/2016 1522   HGB 12.4 12/16/2016 1522   HCT 37.9 12/16/2016 1522   PLT 435.0 (H) 12/16/2016 1522   MCV 82.3 12/16/2016 1522   MCHC 32.7 12/16/2016 1522   RDW 14.8 12/16/2016 1522    Hgb A1C Lab Results  Component Value Date   HGBA1C 5.8 12/16/2016           Assessment & Plan:   Perimenopause:  No need to repeat labs at this time She wishes to stay away from HRT Will trial Effexor 37.5 mg daily Ok to continue Melatonin and Black Cohosh if you feel like it helps  RTC in 3-4 weeks for annual exam, follow up perimenopausal symptoms Nicki Reaperegina Lucelia Lacey, NP

## 2018-06-05 NOTE — Patient Instructions (Signed)
Menopause Menopause is the normal time of life when menstrual periods stop completely. Menopause is complete when you have missed 12 consecutive menstrual periods. It usually occurs between the ages of 48 years and 55 years. Very rarely does a woman develop menopause before the age of 40 years. At menopause, your ovaries stop producing the female hormones estrogen and progesterone. This can cause undesirable symptoms and also affect your health. Sometimes the symptoms may occur 4-5 years before the menopause begins. There is no relationship between menopause and:  Oral contraceptives.  Number of children you had.  Race.  The age your menstrual periods started (menarche).  Heavy smokers and very thin women may develop menopause earlier in life. What are the causes?  The ovaries stop producing the female hormones estrogen and progesterone. Other causes include:  Surgery to remove both ovaries.  The ovaries stop functioning for no known reason.  Tumors of the pituitary gland in the brain.  Medical disease that affects the ovaries and hormone production.  Radiation treatment to the abdomen or pelvis.  Chemotherapy that affects the ovaries.  What are the signs or symptoms?  Hot flashes.  Night sweats.  Decrease in sex drive.  Vaginal dryness and thinning of the vagina causing painful intercourse.  Dryness of the skin and developing wrinkles.  Headaches.  Tiredness.  Irritability.  Memory problems.  Weight gain.  Bladder infections.  Hair growth of the face and chest.  Infertility. More serious symptoms include:  Loss of bone (osteoporosis) causing breaks (fractures).  Depression.  Hardening and narrowing of the arteries (atherosclerosis) causing heart attacks and strokes.  How is this diagnosed?  When the menstrual periods have stopped for 12 straight months.  Physical exam.  Hormone studies of the blood. How is this treated? There are many treatment  choices and nearly as many questions about them. The decisions to treat or not to treat menopausal changes is an individual choice made with your health care provider. Your health care provider can discuss the treatments with you. Together, you can decide which treatment will work best for you. Your treatment choices may include:  Hormone therapy (estrogen and progesterone).  Non-hormonal medicines.  Treating the individual symptoms with medicine (for example antidepressants for depression).  Herbal medicines that may help specific symptoms.  Counseling by a psychiatrist or psychologist.  Group therapy.  Lifestyle changes including: ? Eating healthy. ? Regular exercise. ? Limiting caffeine and alcohol. ? Stress management and meditation.  No treatment.  Follow these instructions at home:  Take the medicine your health care provider gives you as directed.  Get plenty of sleep and rest.  Exercise regularly.  Eat a diet that contains calcium (good for the bones) and soy products (acts like estrogen hormone).  Avoid alcoholic beverages.  Do not smoke.  If you have hot flashes, dress in layers.  Take supplements, calcium, and vitamin D to strengthen bones.  You can use over-the-counter lubricants or moisturizers for vaginal dryness.  Group therapy is sometimes very helpful.  Acupuncture may be helpful in some cases. Contact a health care provider if:  You are not sure you are in menopause.  You are having menopausal symptoms and need advice and treatment.  You are still having menstrual periods after age 55 years.  You have pain with intercourse.  Menopause is complete (no menstrual period for 12 months) and you develop vaginal bleeding.  You need a referral to a specialist (gynecologist, psychiatrist, or psychologist) for treatment. Get help right   away if:  You have severe depression.  You have excessive vaginal bleeding.  You fell and think you have a  broken bone.  You have pain when you urinate.  You develop leg or chest pain.  You have a fast pounding heart beat (palpitations).  You have severe headaches.  You develop vision problems.  You feel a lump in your breast.  You have abdominal pain or severe indigestion. This information is not intended to replace advice given to you by your health care provider. Make sure you discuss any questions you have with your health care provider. Document Released: 01/25/2004 Document Revised: 04/11/2016 Document Reviewed: 06/03/2013 Elsevier Interactive Patient Education  2017 Elsevier Inc.  

## 2018-06-10 ENCOUNTER — Telehealth: Payer: Self-pay | Admitting: Internal Medicine

## 2018-06-10 MED ORDER — PAROXETINE HCL 10 MG PO TABS: 10.0000 mg | ORAL_TABLET | Freq: Every day | ORAL | 2 refills | Status: DC

## 2018-06-10 NOTE — Telephone Encounter (Signed)
We can try Paxil in place of Effexor. Is she willing to try that?

## 2018-06-10 NOTE — Telephone Encounter (Signed)
Paxil sent to [facility]

## 2018-06-10 NOTE — Telephone Encounter (Signed)
Pt is ok with trying Paxil and she will update us with any problems w/ making the change

## 2018-06-10 NOTE — Addendum Note (Signed)
Addended by: Littie DeedsEVONTENNO, Noreta Kue Y on: 06/10/2018 05:00 PM   Modules accepted: Orders

## 2018-06-10 NOTE — Telephone Encounter (Signed)
Copied from CRM [id number]. Topic: Quick Communication - See Telephone Encounter >> Jun 10, 2018 11:23 AM Jolayne Hainesaylor, Brittany L wrote: CRM for notification. See Telephone encounter for: 06/10/18.  Patient said she does not like the side effects for venlafaxine XR (EFFEXOR-XR) 37.5 MG 24 hr capsule, patient is requesting something else in place of this that she can have to help rest at night. One of the side effects was " insomnia ".

## 2018-06-10 NOTE — Addendum Note (Signed)
Addended by: Lorre MunroeBAITY, REGINA W on: 06/10/2018 08:55 PM   Modules accepted: Orders

## 2018-09-18 ENCOUNTER — Encounter: Payer: Self-pay | Admitting: Internal Medicine

## 2018-09-18 LAB — HM MAMMOGRAPHY

## 2018-09-21 ENCOUNTER — Encounter: Payer: Self-pay | Admitting: Obstetrics and Gynecology

## 2018-10-09 IMAGING — DX DG FOOT COMPLETE 3+V*R*
3 series · 3 of 3 positions shown · non-contrast
Comparison: None.

CLINICAL DATA: Initial evaluation for right lateral foot pain for 1
month.

EXAM:
RIGHT FOOT COMPLETE - 3+ VIEW

[foot ap]
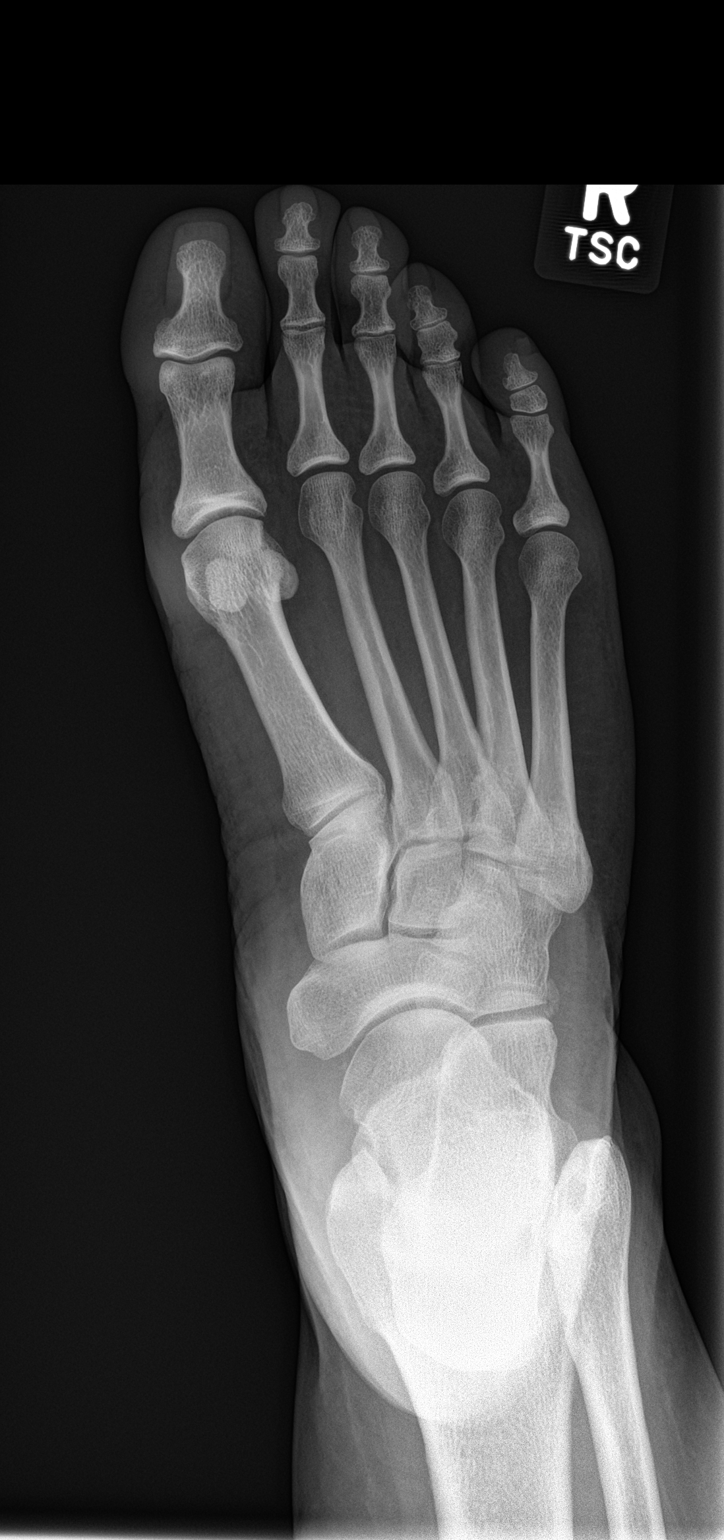

[foot obl]
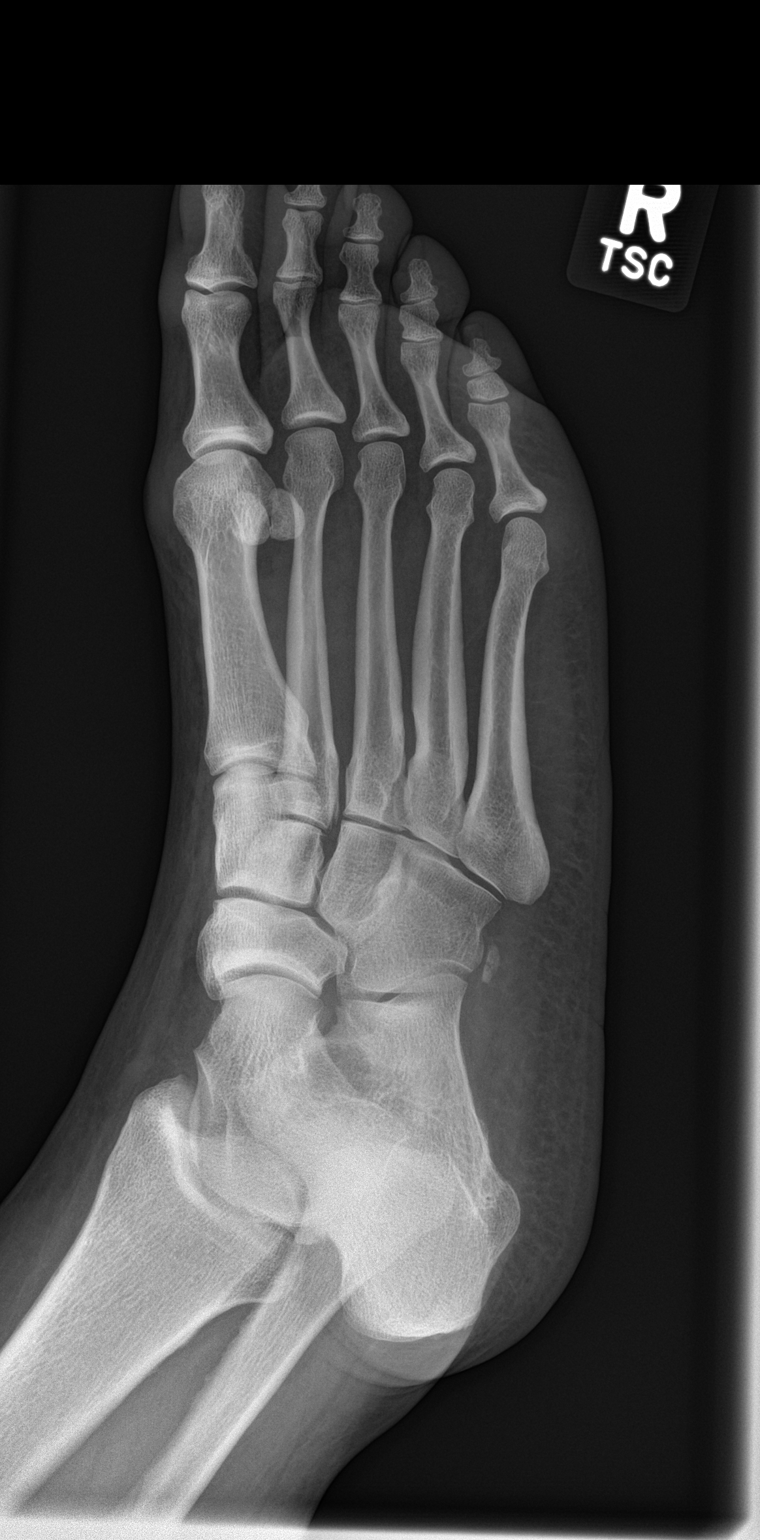

[foot lat]
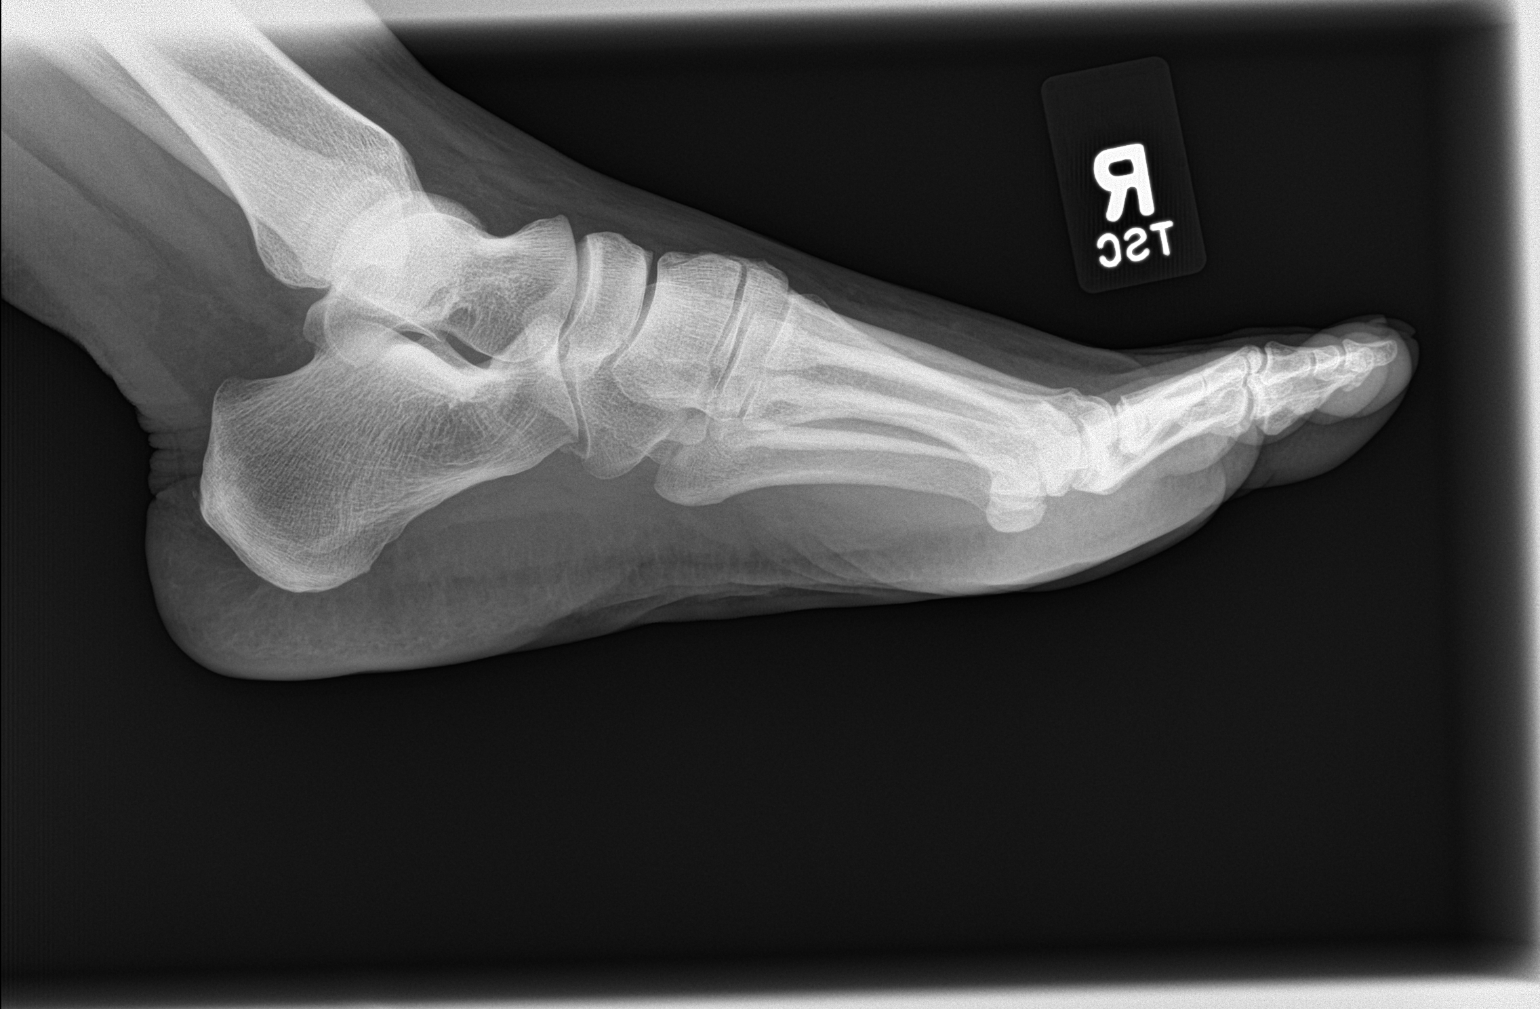

[3 of 3 positions shown; findings below may reference images not displayed]

FINDINGS: No acute fracture or dislocation. Joint spaces well maintained
without evidence for significant degenerative or erosive
arthropathy. Rane mood lead corticated osseous density at the lateral
aspect of the cuboid compatible with a normal assess for ossicle. No
acute soft tissue abnormality. Osseous mineralization normal.
IMPRESSION: Negative radiograph of the right foot. No findings to explain
patient's symptoms identified.

## 2018-10-30 ENCOUNTER — Ambulatory Visit: Payer: BLUE CROSS/BLUE SHIELD | Admitting: Internal Medicine

## 2018-10-30 ENCOUNTER — Encounter: Payer: Self-pay | Admitting: Internal Medicine

## 2018-10-30 VITALS — BP 122/78 | HR 87 | Temp 98.3°F | Wt 248.0 lb

## 2018-10-30 DIAGNOSIS — J209 Acute bronchitis, unspecified: Secondary | ICD-10-CM

## 2018-10-30 MED ORDER — PREDNISONE 10 MG PO TABS: ORAL_TABLET | ORAL | 0 refills | Status: DC

## 2018-10-30 MED ORDER — HYDROCODONE-HOMATROPINE 5-1.5 MG/5ML PO SYRP: 5.0000 mL | ORAL_SOLUTION | Freq: Three times a day (TID) | ORAL | 0 refills | Status: DC | PRN

## 2018-10-30 MED ORDER — AZITHROMYCIN 250 MG PO TABS: ORAL_TABLET | ORAL | 0 refills | Status: DC

## 2018-10-30 NOTE — Progress Notes (Signed)
HPI  Pt presents to the clinic today with c/o cough and chest congestion. She reports this started 1 week ago. The cough is non productive. She denies runny nose, nasal congestion, ear pain, sore throat or shortness of breath. She denies fever, chills or body aches. She has tried Nyquil and Zicam with minimal relief. She has not had sick contacts. She has a history of allergies.  Review of Systems      Past Medical History:  Diagnosis Date  . Allergy   . Arthritis    knees  . Hemorrhoids   . Hyperlipidemia     Family History  Problem Relation Age of Onset  . Arthritis Mother   . Hyperlipidemia Mother   . Hypertension Mother   . Heart disease Father   . Breast cancer Paternal Aunt   . Colon cancer Neg Hx   . Colon polyps Neg Hx   . Esophageal cancer Neg Hx   . Rectal cancer Neg Hx   . Stomach cancer Neg Hx     Social History   Socioeconomic History  . Marital status: Married    Spouse name: Not on file  . Number of children: Not on file  . Years of education: Not on file  . Highest education level: Not on file  Occupational History  . Occupation: Psychologist, sport and exerciseTeller manager    Employer: [employer]  Social Needs  . Financial resource strain: Not on file  . Food insecurity:    Worry: Not on file    Inability: Not on file  . Transportation needs:    Medical: Not on file    Non-medical: Not on file  Tobacco Use  . Smoking status: Never Smoker  . Smokeless tobacco: Never Used  Substance and Sexual Activity  . Alcohol use: Yes    Alcohol/week: 0.0 standard drinks    Comment: occasional  . Drug use: No  . Sexual activity: Yes    Birth control/protection: Surgical  Lifestyle  . Physical activity:    Days per week: Not on file    Minutes per session: Not on file  . Stress: Not on file  Relationships  . Social connections:    Talks on phone: Not on file    Gets together: Not on file    Attends religious service: Not on file    Active member of club or organization: Not on  file    Attends meetings of clubs or organizations: Not on file    Relationship status: Not on file  . Intimate partner violence:    Fear of current or ex partner: Not on file    Emotionally abused: Not on file    Physically abused: Not on file    Forced sexual activity: Not on file  Other Topics Concern  . Not on file  Social History Narrative  . Not on file    Allergies  Allergen Reactions  . Biaxin [Clarithromycin] Other (See Comments)    Severe abdominal cramping  . Penicillins Hives     Constitutional:  Denies headache, fatigue, fever or abrupt weight changes.  HEENT:  Denies eye redness, eye pain, pressure behind the eyes, facial pain, nasal congestion, ear pain, ringing in the ears, wax buildup, runny nose or sore throat. Respiratory: Positive cough. Denies difficulty breathing or shortness of breath.  Cardiovascular: Denies chest pain, chest tightness, palpitations or swelling in the hands or feet.   No other specific complaints in a complete review of systems (except as listed in  HPI above).  Objective:   BP 122/78   Pulse 87   Temp 98.3 F (36.8 C) (Oral)   Wt 248 lb (112.5 kg)   LMP 10/30/2018   SpO2 98%   BMI 43.93 kg/m  Wt Readings from Last 3 Encounters:  10/30/18 248 lb (112.5 kg)  06/05/18 235 lb (106.6 kg)  07/10/17 235 lb 4 oz (106.7 kg)     General: Appears her stated age, obese, in NAD. HEENT: Head: normal shape and size, no sinus tenderness; Ears: Tm's gray and intact, normal light reflex; Nose: mucosa boggy and moist, turbinates swollen; Throat/Mouth: + PND. Teeth present, mucosa erythematous and moist, no exudate noted, no lesions or ulcerations noted.  Neck: No cervical lymphadenopathy.  Cardiovascular: Normal rate and rhythm. S1,S2 noted.  No murmur, rubs or gallops noted.  Pulmonary/Chest: Normal effort with bilateral expiratory wheeze and scattered rhonchi throughout. No respiratory distress. No rales noted.       Assessment & Plan:    Acute Bronchitis:  Get some rest and drink plenty of water eRx for Pred Taper x 6 days eRx for Azithromax x 5 days eRx for Hycodan cough syrup Work note provided  RTC as needed or if symptoms persist.   Nicki Reaper, NP

## 2018-10-30 NOTE — Patient Instructions (Signed)

## 2019-02-02 ENCOUNTER — Encounter: Payer: Self-pay | Admitting: Internal Medicine

## 2019-02-02 ENCOUNTER — Other Ambulatory Visit: Payer: Self-pay

## 2019-02-02 ENCOUNTER — Ambulatory Visit: Payer: BLUE CROSS/BLUE SHIELD | Admitting: Internal Medicine

## 2019-02-02 VITALS — BP 122/82 | HR 85 | Temp 98.0°F | Wt 245.0 lb

## 2019-02-02 DIAGNOSIS — N951 Menopausal and female climacteric states: Secondary | ICD-10-CM | POA: Diagnosis not present

## 2019-02-02 DIAGNOSIS — Z7689 Persons encountering health services in other specified circumstances: Secondary | ICD-10-CM

## 2019-02-02 NOTE — Progress Notes (Signed)
Subjective:    Patient ID: Amy Grimes, female    DOB: 1966-04-11, 53 y.o.   MRN: [id number]  HPI  Pt presents to the clinic today requesting a work note. She reports she went on a [place]. Her employer is requesting a note that she does not have s/s of coronavirus in able to return back to work. She denies fever, sore throat, cough or shortness of breath at this time. She reports people of concern were tested prior to boarding and before departure. To her knowledge, no one tested positive for coronavirus.  She also would like to know what she can take for hot flashes. She has not had her menses in 4 months. She has not tried anything OTC for this.   Review of Systems  Past Medical History:  Diagnosis Date  . Allergy   . Arthritis    knees  . Hemorrhoids   . Hyperlipidemia     Current Outpatient Medications  Medication Sig Dispense Refill  . azithromycin (ZITHROMAX) 250 MG tablet Take 2 tabs today, then 1 tab daily x 4 days 6 tablet 0  . Cholecalciferol (VITAMIN D3) 5000 units CAPS Take 1 capsule by mouth daily.    Marland Kitchen HYDROcodone-homatropine (HYCODAN) 5-1.5 MG/5ML syrup Take 5 mLs by mouth every 8 (eight) hours as needed for cough. 120 mL 0  . ibuprofen (ADVIL,MOTRIN) 800 MG tablet ibuprofen 800 mg tablet    . predniSONE (DELTASONE) 10 MG tablet Take 6 tabs day 1, 5 tabs day 2, 4 tabs day 3, 3 tabs day 4, 2 tabs day 5, 1 tab day 6 21 tablet 0   No current facility-administered medications for this visit.     Allergies  Allergen Reactions  . Biaxin [Clarithromycin] Other (See Comments)    Severe abdominal cramping  . Penicillins Hives    Family History  Problem Relation Age of Onset  . Arthritis Mother   . Hyperlipidemia Mother   . Hypertension Mother   . Heart disease Father   . Breast cancer Paternal Aunt   . Colon cancer Neg Hx   . Colon polyps Neg Hx   . Esophageal cancer Neg Hx   . Rectal cancer Neg Hx   . Stomach  cancer Neg Hx     Social History   Socioeconomic History  . Marital status: Married    Spouse name: Not on file  . Number of children: Not on file  . Years of education: Not on file  . Highest education level: Not on file  Occupational History  . Occupation: Psychologist, sport and exercise: [employer]  Social Needs  . Financial resource strain: Not on file  . Food insecurity:    Worry: Not on file    Inability: Not on file  . Transportation needs:    Medical: Not on file    Non-medical: Not on file  Tobacco Use  . Smoking status: Never Smoker  . Smokeless tobacco: Never Used  Substance and Sexual Activity  . Alcohol use: Yes    Alcohol/week: 0.0 standard drinks    Comment: occasional  . Drug use: No  . Sexual activity: Yes    Birth control/protection: Surgical  Lifestyle  . Physical activity:    Days per week: Not on file    Minutes per session: Not on file  . Stress: Not on file  Relationships  . Social connections:    Talks on phone: Not on file  Gets together: Not on file    Attends religious service: Not on file    Active member of club or organization: Not on file    Attends meetings of clubs or organizations: Not on file    Relationship status: Not on file  . Intimate partner violence:    Fear of current or ex partner: Not on file    Emotionally abused: Not on file    Physically abused: Not on file    Forced sexual activity: Not on file  Other Topics Concern  . Not on file  Social History Narrative  . Not on file     Constitutional: Denies fever, malaise, fatigue, headache or abrupt weight changes.  HEENT: Denies eye pain, eye redness, ear pain, ringing in the ears, wax buildup, runny nose, nasal congestion, bloody nose, or sore throat. Respiratory: Denies difficulty breathing, shortness of breath, cough or sputum production.   Cardiovascular: Denies chest pain, chest tightness, palpitations or swelling in the hands or feet.  Gastrointestinal: Denies  abdominal pain, bloating, constipation, diarrhea or blood in the stool.  Neurological: Pt reports hot flashes. Denies dizziness, difficulty with memory, difficulty with speech or problems with balance and coordination.    No other specific complaints in a complete review of systems (except as listed in HPI above).     Objective:   Physical Exam   BP 122/82   Pulse 85   Temp 98 F (36.7 C) (Oral)   Wt 245 lb (111.1 kg)   SpO2 98%   BMI 43.40 kg/m  Wt Readings from Last 3 Encounters:  02/02/19 245 lb (111.1 kg)  10/30/18 248 lb (112.5 kg)  06/05/18 235 lb (106.6 kg)    General: Appears her stated age, obese, in NAD. HEENT: Head: normal shape and size;  Ears: Tm's gray and intact, normal light reflex; Nose: mucosa pink and moist, septum midline; Throat/Mouth: Teeth present, mucosa pink and moist, no exudate, lesions or ulcerations noted.  Neck:  No adenopathy noted. Cardiovascular: Normal rate and rhythm. S1,S2 noted.  No murmur, rubs or gallops noted.  Pulmonary/Chest: Normal effort and positive vesicular breath sounds. No respiratory distress. No wheezes, rales or ronchi noted.  Neurological: Alert and oriented.  Psychiatric: Mood and affect normal. Behavior is normal. Judgment and thought content normal.     BMET    Component Value Date/Time   NA 137 12/16/2016 1522   K 3.9 12/16/2016 1522   CL 104 12/16/2016 1522   CO2 27 12/16/2016 1522   GLUCOSE 99 12/16/2016 1522   BUN 16 12/16/2016 1522   CREATININE 1.05 12/16/2016 1522   CALCIUM 9.1 12/16/2016 1522    Lipid Panel     Component Value Date/Time   CHOL 206 (H) 12/16/2016 1522   TRIG 73.0 12/16/2016 1522   HDL 53.30 12/16/2016 1522   CHOLHDL 4 12/16/2016 1522   VLDL 14.6 12/16/2016 1522   LDLCALC 138 (H) 12/16/2016 1522    CBC    Component Value Date/Time   WBC 8.3 12/16/2016 1522   RBC 4.61 12/16/2016 1522   HGB 12.4 12/16/2016 1522   HCT 37.9 12/16/2016 1522   PLT 435.0 (H) 12/16/2016 1522   MCV  82.3 12/16/2016 1522   MCHC 32.7 12/16/2016 1522   RDW 14.8 12/16/2016 1522    Hgb A1C Lab Results  Component Value Date   HGBA1C 5.8 12/16/2016           Assessment & Plan:   Return to Work Evaulation:  No s/s  of coronavirus, no known contacts, no indication for testing Work note provided  [employer], Perimenopause:  Start Black Cohosh OTC  Return precautions discussed Nicki Reaper, NP

## 2019-02-02 NOTE — Patient Instructions (Signed)

## 2019-02-22 ENCOUNTER — Encounter: Payer: Self-pay | Admitting: Internal Medicine

## 2019-02-22 ENCOUNTER — Other Ambulatory Visit: Payer: Self-pay

## 2019-02-22 ENCOUNTER — Ambulatory Visit (INDEPENDENT_AMBULATORY_CARE_PROVIDER_SITE_OTHER): Payer: BLUE CROSS/BLUE SHIELD | Admitting: Internal Medicine

## 2019-02-22 DIAGNOSIS — J019 Acute sinusitis, unspecified: Secondary | ICD-10-CM | POA: Diagnosis not present

## 2019-02-22 DIAGNOSIS — B9689 Other specified bacterial agents as the cause of diseases classified elsewhere: Secondary | ICD-10-CM | POA: Diagnosis not present

## 2019-02-22 NOTE — Progress Notes (Signed)
Virtual Visit via Video Note  I connected with Amy Grimes on 02/22/19 at 10:00 AM EDT by a video enabled telemedicine application and verified that I am speaking with the correct person using two identifiers.   I discussed the limitations of evaluation and management by telemedicine and the availability of in person appointments. The patient expressed understanding and agreed to proceed.  History of Present Illness:  Pt due for follow up of urgent care. She went to [facility] 3/27. She was diagnosed with a sinus infection, prescribed Doxycycline. She went back to [facility] 4/2. She was given a neb treatment, prescribed Atrovent nasal spray, Tessalon and advised to take Mucinex OTC. Since that time, she reports she is feeling much better. She still has a slight cough, mostly non productive. She denies sinus headache, runny nose, nasal congestion, ear pain, sore throat or shortness of breath. She denies fever, chills or body aches.   Observations/Objective:  Alert and oriented x 3 NAD No obvious congestion, SOB Behavior and thought content are normal  Assessment and Plan:  Acute Bacterial Sinusitis:   Improved with antibiotics, Atrovent and Tessalon Discussed how this may get slightly worse with pollen/allergies Discussed using OTC antihistamine now and continuing Atrovent nasal spray Follow up precautions discussed  Follow Up Instructions:    I discussed the assessment and treatment plan with the patient. The patient was provided an opportunity to ask questions and all were answered. The patient agreed with the plan and demonstrated an understanding of the instructions.   The patient was advised to call back or seek an in-person evaluation if the symptoms worsen or if the condition fails to improve as anticipated.    Nicki Reaper, NP

## 2019-02-22 NOTE — Patient Instructions (Signed)

## 2019-04-15 ENCOUNTER — Encounter: Payer: BLUE CROSS/BLUE SHIELD | Admitting: Internal Medicine

## 2019-06-02 ENCOUNTER — Encounter: Payer: BLUE CROSS/BLUE SHIELD | Admitting: Internal Medicine

## 2019-09-09 ENCOUNTER — Ambulatory Visit (INDEPENDENT_AMBULATORY_CARE_PROVIDER_SITE_OTHER): Payer: BC Managed Care – PPO

## 2019-09-09 DIAGNOSIS — Z23 Encounter for immunization: Secondary | ICD-10-CM

## 2020-09-26 ENCOUNTER — Encounter: Payer: Self-pay | Admitting: Internal Medicine

## 2021-06-27 ENCOUNTER — Telehealth: Payer: Self-pay

## 2021-06-27 ENCOUNTER — Encounter: Payer: Self-pay | Admitting: Internal Medicine

## 2021-06-27 ENCOUNTER — Other Ambulatory Visit: Payer: Self-pay

## 2021-06-27 MED ORDER — PAXLOVID 20 X 150 MG & 10 X 100MG PO TBPK
ORAL_TABLET | ORAL | 0 refills | Status: DC
  Filled 2021-06-27: qty 30, 5d supply, fill #0

## 2021-06-27 NOTE — Telephone Encounter (Signed)
Outpatient Pharmacy Oral COVID Treatment Note  I connected with Amy Grimes on 06/27/2021/5:04 PM by telephone and verified that I am speaking with the correct person using two identifiers.  I discussed the limitations, risks, security, and privacy concerns of performing an evaluation and management service by telephone and the availability of in person appointments via referral to a physician. The patient expressed understanding and agreed to proceed.  Pharmacy location: [place]  Diagnosis: COVID-19 infection  Purpose of visit: Discussion of potential use of Paxlovid, a new treatment for mild to moderate COVID-19 viral infection in non-hospitalized patients.  Subjective/Objective: Patient is a 55 y.o. female who is presenting with COVID 19 viral infection.  COVID 19 viral infection. Their symptoms began on 06/25/21 with cold symptoms, cough, fatigue.  The patient has confirmed COVID-19 via a PCR test at [facility] on 06/27/21.   Past Medical History:  Diagnosis Date   Allergy    Arthritis    knees   Hemorrhoids    Hyperlipidemia      Allergies  Allergen Reactions   Biaxin [Clarithromycin] Other (See Comments)    Severe abdominal cramping   Penicillins Hives     Current Outpatient Medications:    cetirizine (ZYRTEC) 10 MG tablet, Take 10 mg by mouth daily., Disp: , Rfl:    Cholecalciferol (VITAMIN D3) 5000 units CAPS, Take 1 capsule by mouth daily., Disp: , Rfl:    ipratropium (ATROVENT) 0.03 % nasal spray, PLACE 2 SQUIRTS IN THE NOSTRILS EVERY 6 8 HOURS AS NEEDED AS NEEDED, Disp: , Rfl:    Nirmatrelvir & Ritonavir (PAXLOVID) 20 x 150 MG & 10 x 100MG TBPK, Take 2 nirmatrelvir (150 mg) tablets and take 1 ritonavir (100 mg) tablet (3 tablets total) by mouth two times daily for 5 days, Disp: 30 tablet, Rfl: 0  Lab Monitoring: eGFR Calculated as 63 from lab work dated 10/24/20 via [employer].  Drug Interactions Noted: Patient will hold zolpidem while taking  Paxlovid.  Plan:  This patient is a 55 y.o. female that meets the criteria for Emergency Use Authorization of Paxlovid. After reviewing the emergency use authorization with the patient, the patient agrees to receive Paxlovid.  Through FDA guidance and current [facility] standing order Paxlovid will be prescribed to the patient.   Patient contacted for counseling on 06/27/21 and verbalized understanding.   Delivery or Pick-Up Date: Pick-up on 06/27/21  Follow up instructions:    Take prescription BID x 5 days as directed Counseling was provided by pharmacist. Reach out to pharmacist with follow up questions For concerns regarding further COVID symptoms please follow up with your PCP or urgent care For urgent or life-threatening issues, seek care at your local emergency department   Carolynne Edouard 06/27/2021, 5:04 PM [facility] Pharmacist Phone# [phone]

## 2021-06-28 ENCOUNTER — Other Ambulatory Visit: Payer: Self-pay

## 2022-01-31 ENCOUNTER — Other Ambulatory Visit: Payer: Self-pay

## 2022-01-31 ENCOUNTER — Ambulatory Visit: Payer: BC Managed Care – PPO | Admitting: Internal Medicine

## 2022-01-31 ENCOUNTER — Encounter: Payer: Self-pay | Admitting: Internal Medicine

## 2022-01-31 VITALS — BP 104/66 | HR 85 | Temp 96.9°F | Wt 213.0 lb

## 2022-01-31 DIAGNOSIS — J069 Acute upper respiratory infection, unspecified: Secondary | ICD-10-CM | POA: Diagnosis not present

## 2022-01-31 MED ORDER — PREDNISONE 10 MG PO TABS: ORAL_TABLET | ORAL | 0 refills | Status: DC

## 2022-01-31 NOTE — Patient Instructions (Signed)

## 2022-01-31 NOTE — Progress Notes (Signed)
HPI ? ?Pt presents to the clinic today with c/o headache, nasal congestion, left ear fullness, cough and chest congestion. This started 5 days ago.  The headache is located in her forehead.  She describes the pain as pressure.  She is blowing yellow mucus out of her nose.  The cough is productive of yellow mucus.  She denies runny nose, sore throat, shortness of breath, chest pain, nausea, vomiting or diarrhea.  She did run a fever initially but reports this has resolved.  She denies chills or body aches.  She had a negative flu/covid test. She has tried Dayquil and Catering manager nighttime with minimal relief of symptoms. ? ?Review of Systems ? ? ?   ?Past Medical History:  ?Diagnosis Date  ? Allergy   ? Arthritis   ? knees  ? Hemorrhoids   ? Hyperlipidemia   ? ? ?Family History  ?Problem Relation Age of Onset  ? Arthritis Mother   ? Hyperlipidemia Mother   ? Hypertension Mother   ? Heart disease Father   ? Breast cancer Paternal Aunt   ? Colon cancer Neg Hx   ? Colon polyps Neg Hx   ? Esophageal cancer Neg Hx   ? Rectal cancer Neg Hx   ? Stomach cancer Neg Hx   ? ? ?Social History  ? ?Socioeconomic History  ? Marital status: Married  ?  Spouse name: Not on file  ? Number of children: Not on file  ? Years of education: Not on file  ? Highest education level: Not on file  ?Occupational History  ? Occupation: Corporate treasurer  ?  Employer: [employer]  ?Tobacco Use  ? Smoking status: Never  ? Smokeless tobacco: Never  ?Substance and Sexual Activity  ? Alcohol use: Yes  ?  Alcohol/week: 0.0 standard drinks  ?  Comment: occasional  ? Drug use: No  ? Sexual activity: Yes  ?  Birth control/protection: Surgical  ?Other Topics Concern  ? Not on file  ?Social History Narrative  ? Not on file  ? ?Social Determinants of Health  ? ?Financial Resource Strain: Not on file  ?Food Insecurity: Not on file  ?Transportation Needs: Not on file  ?Physical Activity: Not on file  ?Stress: Not on file  ?Social Connections: Not on file  ?Intimate  Partner Violence: Not on file  ? ? ?Allergies  ?Allergen Reactions  ? Biaxin [Clarithromycin] Other (See Comments)  ?  Severe abdominal cramping  ? Penicillins Hives  ? ? ? ?Constitutional: Positive headache, fatigue and fever. Denies abrupt weight changes.  ?HEENT:  Positive nasal congestion and left ear pain. Denies eye redness, eye pain, pressure behind the eyes, facial pain, ringing in the ears, wax buildup, runny nose or sore throat. ?Respiratory: Positive cough. Denies difficulty breathing or shortness of breath.  ?Cardiovascular: Denies chest pain, chest tightness, palpitations or swelling in the hands or feet.  ? ?No other specific complaints in a complete review of systems (except as listed in HPI above). ? ?Objective:  ? ?BP 104/66 (BP Location: Right Arm, Patient Position: Sitting, Cuff Size: Large)   Pulse 85   Temp (!) 96.9 ?F (36.1 ?C) (Temporal)   Wt 213 lb (96.6 kg)   SpO2 99%   BMI 37.73 kg/m?  ? ?Wt Readings from Last 3 Encounters:  ?02/02/19 245 lb (111.1 kg)  ?10/30/18 248 lb (112.5 kg)  ?06/05/18 235 lb (106.6 kg)  ? ? ? ?General: Appears her stated age, well developed, well nourished in  NAD. ?HEENT: Head: normal shape and size, no sinus tenderness noted; Eyes: sclera white, no icterus, conjunctiva pink; Ears: Tm's gray and intact, normal light reflex;  ?Neck: No cervical lymphadenopathy.  ?Cardiovascular: Normal rate and rhythm. S1,S2 noted.  No murmur, rubs or gallops noted.  ?Pulmonary/Chest: Normal effort and coarse breath sounds. No respiratory distress. No wheezes, rales or ronchi noted.  ? ?    ?Assessment & Plan:  ? ?Viral URI with cough: ? ?Get some rest and drink plenty of water ?eRx for Pred taper x6 days ?No indication for antibiotics at this time ? ?RTC as needed or if symptoms persist. ? ? ?Nicki Reaper, NP ?This visit occurred during the SARS-CoV-2 public health emergency.  Safety protocols were in place, including screening questions prior to the visit, additional usage of  staff PPE, and extensive cleaning of exam room while observing appropriate contact time as indicated for disinfecting solutions.  ? ?

## 2022-02-06 ENCOUNTER — Other Ambulatory Visit: Payer: Self-pay | Admitting: Internal Medicine

## 2022-04-26 ENCOUNTER — Encounter: Payer: Self-pay | Admitting: Internal Medicine

## 2022-04-26 ENCOUNTER — Ambulatory Visit: Payer: BC Managed Care – PPO | Admitting: Internal Medicine

## 2022-04-26 VITALS — BP 122/76 | HR 77 | Temp 96.8°F | Ht 63.0 in | Wt 216.0 lb

## 2022-04-26 DIAGNOSIS — K5903 Drug induced constipation: Secondary | ICD-10-CM | POA: Diagnosis not present

## 2022-04-26 DIAGNOSIS — K921 Melena: Secondary | ICD-10-CM

## 2022-04-26 DIAGNOSIS — K648 Other hemorrhoids: Secondary | ICD-10-CM | POA: Diagnosis not present

## 2022-04-26 MED ORDER — HYDROCORTISONE ACETATE 25 MG RE SUPP: 25.0000 mg | Freq: Two times a day (BID) | RECTAL | 0 refills | Status: DC

## 2022-04-26 NOTE — Progress Notes (Signed)
Subjective:    Patient ID: Amy Grimes, female    DOB: June 13, 1966, 56 y.o.   MRN: [id number]  HPI  Pt presents to the clinic today with c/o blood in her stool. She reports this started last Saturday. She reports when she wiped, she noticed bright red blood. Sunday she had diarrhea. And then today, she noticed bright blood in the toilet after a BM. She does feel constipated due to [facility]. She denies nausea, vomiting or abdominal pain.  She reports known hemorrhoids but she reports she can typically tell when these are flared up because she will have itching and burning and she has none of this.  She typically treats this with Tucks pads.  Colonoscopy from 2018.  Review of Systems   Past Medical History:  Diagnosis Date   Allergy    Arthritis    knees   Hemorrhoids    Hyperlipidemia     Current Outpatient Medications  Medication Sig Dispense Refill   predniSONE (DELTASONE) 10 MG tablet Take 6 tabs on day 1, 5 tabs on day 2, 4 tabs on day 3, 3 tabs on day 4, 2 tabs on day 5, 1 tab on day 6 21 tablet 0   zolpidem (AMBIEN) 10 MG tablet Take 10 mg by mouth at bedtime.     No current facility-administered medications for this visit.    Allergies  Allergen Reactions   Biaxin [Clarithromycin] Other (See Comments)    Severe abdominal cramping   Penicillins Hives    Family History  Problem Relation Age of Onset   Arthritis Mother    Hyperlipidemia Mother    Hypertension Mother    Heart disease Father    Breast cancer Paternal Aunt    Colon cancer Neg Hx    Colon polyps Neg Hx    Esophageal cancer Neg Hx    Rectal cancer Neg Hx    Stomach cancer Neg Hx     Social History   Socioeconomic History   Marital status: Married    Spouse name: Not on file   Number of children: Not on file   Years of education: Not on file   Highest education level: Not on file  Occupational History   Occupation: Psychologist, sport and exercise: [employer]  Tobacco Use   Smoking status: Never    Smokeless tobacco: Never  Substance and Sexual Activity   Alcohol use: Yes    Alcohol/week: 0.0 standard drinks of alcohol    Comment: occasional   Drug use: No   Sexual activity: Yes    Birth control/protection: Surgical  Other Topics Concern   Not on file  Social History Narrative   Not on file   Social Determinants of Health   Financial Resource Strain: Not on file  Food Insecurity: Not on file  Transportation Needs: Not on file  Physical Activity: Not on file  Stress: Not on file  Social Connections: Not on file  Intimate Partner Violence: Not on file     Constitutional: Denies fever, malaise, fatigue, headache or abrupt weight changes.  Respiratory: Denies difficulty breathing, shortness of breath, cough or sputum production.   Cardiovascular: Denies chest pain, chest tightness, palpitations or swelling in the hands or feet.  Gastrointestinal: Patient reports constipation and blood in stool.  Denies abdominal pain, bloating, diarrhea.   No other specific complaints in a complete review of systems (except as listed in HPI above).     Objective:   Physical Exam  BP  122/76 (BP Location: Left Arm, Patient Position: Sitting, Cuff Size: Normal)   Pulse 77   Temp (!) 96.8 F (36 C) (Temporal)   Ht 5\' 3"  (1.6 m)   Wt 216 lb (98 kg)   SpO2 100%   BMI 38.26 kg/m   Wt Readings from Last 3 Encounters:  01/31/22 213 lb (96.6 kg)  02/02/19 245 lb (111.1 kg)  10/30/18 248 lb (112.5 kg)    General: Appears her stated age, obese, in NAD. Cardiovascular: Normal rate and rhythm. S1,S2 noted.  No murmur, rubs or gallops noted.  Pulmonary/Chest: Normal effort and positive vesicular breath sounds. No respiratory distress. No wheezes, rales or ronchi noted.  Abdomen: Soft and nontender. Normal bowel sounds. No distention or masses noted. Rectal: External hemorrhoids noted, nonbleeding.  Normal rectal tone.  No internal mass noted.  Hemoccult positive stool. Neurological: Alert  and oriented.   BMET    Component Value Date/Time   NA 137 12/16/2016 1522   K 3.9 12/16/2016 1522   CL 104 12/16/2016 1522   CO2 27 12/16/2016 1522   GLUCOSE 99 12/16/2016 1522   BUN 16 12/16/2016 1522   CREATININE 1.05 12/16/2016 1522   CALCIUM 9.1 12/16/2016 1522    Lipid Panel     Component Value Date/Time   CHOL 206 (H) 12/16/2016 1522   TRIG 73.0 12/16/2016 1522   HDL 53.30 12/16/2016 1522   CHOLHDL 4 12/16/2016 1522   VLDL 14.6 12/16/2016 1522   LDLCALC 138 (H) 12/16/2016 1522    CBC    Component Value Date/Time   WBC 8.3 12/16/2016 1522   RBC 4.61 12/16/2016 1522   HGB 12.4 12/16/2016 1522   HCT 37.9 12/16/2016 1522   PLT 435.0 (H) 12/16/2016 1522   MCV 82.3 12/16/2016 1522   MCHC 32.7 12/16/2016 1522   RDW 14.8 12/16/2016 1522    Hgb A1C Lab Results  Component Value Date   HGBA1C 5.8 12/16/2016            Assessment & Plan:   Constipation, Blood in Stool secondary to Internal Hemorrhoids  Start MiraLAX 17 g daily in 8 ounces of sugar-free apple juice Encourage high-fiber diet and adequate water intake Rx for Anusol suppositories twice daily x7 days Consider referral to GI if symptoms persist or worsen  Return precautions discussed 12/18/2016, NP

## 2022-04-26 NOTE — Patient Instructions (Signed)
Hemorrhoids Hemorrhoids are swollen veins that may develop: In the butt (rectum). These are called internal hemorrhoids. Around the opening of the butt (anus). These are called external hemorrhoids. Hemorrhoids can cause pain, itching, or bleeding. Most of the time, they do not cause serious problems. They usually get better with diet changes, lifestyle changes, and other home treatments. What are the causes? This condition may be caused by: Having trouble pooping (constipation). Pushing hard (straining) to poop. Watery poop (diarrhea). Pregnancy. Being very overweight (obese). Sitting for long periods of time. Heavy lifting or other activity that causes you to strain. Anal sex. Riding a bike for a long period of time. What are the signs or symptoms? Symptoms of this condition include: Pain. Itching or soreness in the butt. Bleeding from the butt. Leaking poop. Swelling in the area. One or more lumps around the opening of your butt. How is this diagnosed? A doctor can often diagnose this condition by looking at the affected area. The doctor may also: Do an exam that involves feeling the area with a gloved hand (digital rectal exam). Examine the area inside your butt using a small tube (anoscope). Order blood tests. This may be done if you have lost a lot of blood. Have you get a test that involves looking inside the colon using a flexible tube with a camera on the end (sigmoidoscopy or colonoscopy). How is this treated? This condition can usually be treated at home. Your doctor may tell you to change what you eat, make lifestyle changes, or try home treatments. If these do not help, procedures can be done to remove the hemorrhoids or make them smaller. These may involve: Placing rubber bands at the base of the hemorrhoids to cut off their blood supply. Injecting medicine into the hemorrhoids to shrink them. Shining a type of light energy onto the hemorrhoids to cause them to fall  off. Doing surgery to remove the hemorrhoids or cut off their blood supply. Follow these instructions at home: Eating and drinking  Eat foods that have a lot of fiber in them. These include whole grains, beans, nuts, fruits, and vegetables. Ask your doctor about taking products that have added fiber (fibersupplements). Reduce the amount of fat in your diet. You can do this by: Eating low-fat dairy products. Eating less red meat. Avoiding processed foods. Drink enough fluid to keep your pee (urine) pale yellow. Managing pain and swelling  Take a warm-water bath (sitz bath) for 20 minutes to ease pain. Do this 3-4 times a day. You may do this in a bathtub or using a portable sitz bath that fits over the toilet. If told, put ice on the painful area. It may be helpful to use ice between your warm baths. Put ice in a plastic bag. Place a towel between your skin and the bag. Leave the ice on for 20 minutes, 2-3 times a day. General instructions Take over-the-counter and prescription medicines only as told by your doctor. Medicated creams and medicines may be used as told. Exercise often. Ask your doctor how much and what kind of exercise is best for you. Go to the bathroom when you have the urge to poop. Do not wait. Avoid pushing too hard when you poop. Keep your butt dry and clean. Use wet toilet paper or moist towelettes after pooping. Do not sit on the toilet for a long time. Keep all follow-up visits as told by your doctor. This is important. Contact a doctor if you: Have pain and   swelling that do not get better with treatment or medicine. Have trouble pooping. Cannot poop. Have pain or swelling outside the area of the hemorrhoids. Get help right away if you have: Bleeding that will not stop. Summary Hemorrhoids are swollen veins in the butt or around the opening of the butt. They can cause pain, itching, or bleeding. Eat foods that have a lot of fiber in them. These include  whole grains, beans, nuts, fruits, and vegetables. Take a warm-water bath (sitz bath) for 20 minutes to ease pain. Do this 3-4 times a day. This information is not intended to replace advice given to you by your health care provider. Make sure you discuss any questions you have with your health care provider. Document Revised: 05/16/2021 Document Reviewed: 05/16/2021 Elsevier Patient Education  2023 [employer]  

## 2022-05-03 ENCOUNTER — Other Ambulatory Visit (HOSPITAL_COMMUNITY): Payer: Self-pay

## 2022-05-03 MED ORDER — WEGOVY 1.7 MG/0.75ML ~~LOC~~ SOAJ
1.7000 mg | SUBCUTANEOUS | 0 refills | Status: DC
Start: 2022-05-03 — End: 2023-03-17
  Filled 2022-05-03: qty 3, 28d supply, fill #0

## 2022-05-06 ENCOUNTER — Other Ambulatory Visit (HOSPITAL_COMMUNITY): Payer: Self-pay

## 2022-08-29 ENCOUNTER — Encounter: Payer: Self-pay | Admitting: Physician Assistant

## 2022-08-29 ENCOUNTER — Other Ambulatory Visit (HOSPITAL_COMMUNITY)
Admission: RE | Admit: 2022-08-29 | Discharge: 2022-08-29 | Disposition: A | Discharge status: Home / Self Care | Payer: BC Managed Care – PPO | Source: Ambulatory Visit | Attending: Physician Assistant | Admitting: Physician Assistant

## 2022-08-29 ENCOUNTER — Ambulatory Visit: Payer: BC Managed Care – PPO | Admitting: Physician Assistant

## 2022-08-29 VITALS — BP 118/84 | HR 110 | Temp 96.8°F | Wt 205.0 lb

## 2022-08-29 DIAGNOSIS — B9689 Other specified bacterial agents as the cause of diseases classified elsewhere: Secondary | ICD-10-CM | POA: Diagnosis not present

## 2022-08-29 DIAGNOSIS — A5901 Trichomonal vulvovaginitis: Secondary | ICD-10-CM | POA: Insufficient documentation

## 2022-08-29 DIAGNOSIS — M25511 Pain in right shoulder: Secondary | ICD-10-CM | POA: Insufficient documentation

## 2022-08-29 DIAGNOSIS — N760 Acute vaginitis: Secondary | ICD-10-CM | POA: Insufficient documentation

## 2022-08-29 DIAGNOSIS — N898 Other specified noninflammatory disorders of vagina: Secondary | ICD-10-CM | POA: Insufficient documentation

## 2022-08-29 DIAGNOSIS — Z113 Encounter for screening for infections with a predominantly sexual mode of transmission: Secondary | ICD-10-CM | POA: Insufficient documentation

## 2022-08-29 NOTE — Assessment & Plan Note (Signed)
Acute, new concern Reports pain with abduction and lifting, especially at work She has remote hx of falling onto this shoulder so will check xrays to rule out abnormal healing/ arthritis.  Recommend conservative measures at this time - rest, warm compresses, alternating Tylenol and Ibuprofen, massage and stretches (provided in AVS) If symptoms are not improving may need to refer to Ortho or PT for evaluation and assistance with management Follow up as needed

## 2022-08-29 NOTE — Assessment & Plan Note (Signed)
Acute, new concern  Reports vaginal discharge changes and some skin irritation, denies dysuria or fevers Will run cervicovaginal swab for BV, trich, yeast, gonorrhea and chlamydia  Results to dictate further management Follow up as needed for persistent or progressing symptoms

## 2022-08-29 NOTE — Patient Instructions (Signed)
  I recommend the following at this time to help relieve the discomfort in your shoulder   Rest Warm compresses to the area (20 minutes on, minimum of 30 minutes off) You can alternate Tylenol and Ibuprofen for pain management but Ibuprofen is typically preferred to reduce inflammation.  Gentle stretches and exercises that I have included in your paperwork Try to reduce excess strain to the area and rest as much as possible  Wear supportive shoes and, if you must lift anything, use proper lifting techniques that spare your back.   If these measures do not lead to improvement in your symptoms over the next 2-4 weeks please let us know

## 2022-08-29 NOTE — Progress Notes (Signed)
Acute Office Visit   Patient: Amy Grimes   DOB: [birth year]   56 y.o. Female  MRN: [id number] Visit Date: 08/29/2022  Today's healthcare provider: Oswaldo Conroy Iana Buzan, PA-C  Introduced myself to the patient as a Secondary school teacher and provided education on APPs in clinical practice.    Chief Complaint  Patient presents with   Vaginal Discharge   Shoulder Pain   Subjective    HPI    Vaginal Discharge Reports she noticed changes on Sunday Reports she has noticed a mild smell,  Denies bloody, watery, whitish discharge Denies vaginal pain  Reports she has a rash along vulvovaginal and groin area She reports she shaved recently and she will sometimes have skin irritation from this Her last sexual encounter was about 2 weeks ago  Shoulder Pain She is right hand dominant  Reports she has had right shoulder pain for the past few months States she has pain when she tries to raise her shoulder  Reports she is a side sleeper and sleeps predominantly on her right side  Denies numbness or tingling in lower arm, hands and fingers Reports pain along the back of the upper arm Alleviating: Ibuprofen seems to provide relief, massage seems to improve as well Aggravating: picking up something heavy  Interventions: Ibuprofen Pain level: 4-5/10 dull in character   She fell 3 years ago and landed on this shoulder    Medications: Outpatient Medications Prior to Visit  Medication Sig   hydrocortisone (ANUSOL-HC) 25 MG suppository Place 1 suppository (25 mg total) rectally 2 (two) times daily. For 7 days   Semaglutide-Weight Management (WEGOVY) 2.4 MG/0.75ML SOAJ Inject 2.4 mg into the skin once a week.   zolpidem (AMBIEN) 10 MG tablet Take 10 mg by mouth at bedtime.   Semaglutide-Weight Management (WEGOVY) 0.5 MG/0.5ML SOAJ Wegovy 0.5 mg/0.5 mL subcutaneous pen injector  INJECT 0.5 ML SUBCUTANEOUSLY ONCE WEEKLY AS DIRECTED FOR 28 DAYS   Semaglutide-Weight Management (WEGOVY) 1.7 MG/0.75ML SOAJ  Inject 1.7 mg into the skin once a week.   No facility-administered medications prior to visit.    Review of Systems  Genitourinary:  Positive for vaginal discharge. Negative for decreased urine volume, difficulty urinating, dysuria, flank pain, frequency, hematuria, pelvic pain, urgency, vaginal bleeding and vaginal pain.  Musculoskeletal:  Positive for arthralgias (right shoulder).       Objective    BP 118/84 (BP Location: Left Arm, Patient Position: Sitting, Cuff Size: Normal)   Pulse (!) 110   Temp (!) 96.8 F (36 C) (Temporal)   Wt 205 lb (93 kg)   SpO2 99%   BMI 36.31 kg/m    Physical Exam Vitals reviewed.  Constitutional:      General: She is awake.     Appearance: Normal appearance. She is well-developed and well-groomed.  HENT:     Head: Normocephalic and atraumatic.  Eyes:     Extraocular Movements: Extraocular movements intact.     Conjunctiva/sclera: Conjunctivae normal.     Pupils: Pupils are equal, round, and reactive to light.  Pulmonary:     Effort: Pulmonary effort is normal.  Musculoskeletal:     Right shoulder: Tenderness and crepitus present. No swelling, deformity or effusion. Normal range of motion. Normal strength.     Left shoulder: No swelling, deformity, effusion, tenderness or crepitus. Normal range of motion. Normal strength.     Cervical back: Normal range of motion and neck supple.     Comments: Right shoulder :  tenderness noted in joint space between acromion and humeral head ROM is intact  Empty can test normal Internal and external rotation are intact Pain caused halting motion with abduction but she was able to raise arm full above head   Neurological:     Mental Status: She is alert.  Psychiatric:        Behavior: Behavior is cooperative.       No results found for any visits on 08/29/22.  Assessment & Plan      No follow-ups on file.       Problem List Items Addressed This Visit       Other   Acute pain of right  shoulder - Primary    Acute, new concern Reports pain with abduction and lifting, especially at work She has remote hx of falling onto this shoulder so will check xrays to rule out abnormal healing/ arthritis.  Recommend conservative measures at this time - rest, warm compresses, alternating Tylenol and Ibuprofen, massage and stretches (provided in AVS) If symptoms are not improving may need to refer to Ortho or PT for evaluation and assistance with management Follow up as needed      Relevant Orders   DG Shoulder Right   Vaginal discharge    Acute, new concern  Reports vaginal discharge changes and some skin irritation, denies dysuria or fevers Will run cervicovaginal swab for BV, trich, yeast, gonorrhea and chlamydia  Results to dictate further management Follow up as needed for persistent or progressing symptoms       Relevant Orders   Cervicovaginal ancillary only     No follow-ups on file.   I, Lowana Hable E Carmellia Kreisler, PA-C, have reviewed all documentation for this visit. The documentation on 08/29/22 for the exam, diagnosis, procedures, and orders are all accurate and complete.   Talitha Givens, MHS, PA-C [facility]

## 2022-08-30 ENCOUNTER — Ambulatory Visit: Payer: Self-pay

## 2022-08-30 NOTE — Telephone Encounter (Signed)
  Chief Complaint: Vaginal discharge - test results pending Symptoms: itch discharge Frequency:  Pertinent Negatives: Patient denies  Disposition: [] ED /[] Urgent Care (no appt availability in office) / [] Appointment(In office/virtual)/ []  [facility]/ [x] Home Care/ [] Refused Recommended Disposition /[] [facility]/ [x]  Follow-up with PCP Additional Notes: Pt was seen at the office yesterday for same s/s. Vaginal swab was performed, however test results are not yet back. Pt will go to pharmacy and try otc medications for yeast infection hoping for some relief. PT will follow up on Monday for test results and further treatment.    Reason for Disposition  Symptoms of a vaginal yeast infection (i.e., white, thick, cottage-cheese-like, itchy, not bad smelling discharge)  Answer Assessment - Initial Assessment Questions 1. DISCHARGE: "Describe the discharge." (e.g., white, yellow, green, gray, foamy, cottage cheese-like)      2. ODOR: "Is there a bad odor?"      3. ONSET: "When did the discharge begin?"      4. RASH: "Is there a rash in the genital area?" If Yes, ask: "Describe it." (e.g., redness, blisters, sores, bumps)      5. ABDOMEN PAIN: "Are you having any abdomen pain?" If Yes, ask: "What does it feel like? " (e.g., crampy, dull, intermittent, constant)       6. ABDOMEN PAIN SEVERITY: If present, ask: "How bad is it?" (e.g., Scale 1-10; mild, moderate, or severe)   - MILD (1-3): Doesn't interfere with normal activities, abdomen soft and not tender to touch.    - MODERATE (4-7): Interferes with normal activities or awakens from sleep, abdomen tender to touch.    - SEVERE (8-10): Excruciating pain, doubled over, unable to do any normal activities. (R/O peritonitis)       7. CAUSE: "What do you think is causing the discharge?" "Have you had the same problem before? What happened then?"     Possible yeast infection 8. OTHER SYMPTOMS: "Do you have any other  symptoms?" (e.g., fever, itching, vaginal bleeding, pain with urination, injury to genital area, vaginal foreign body)     itch 9. PREGNANCY: "Is there any chance you are pregnant?" "When was your last menstrual period?"  Protocols used: Vaginal Discharge-A-AH

## 2022-09-02 ENCOUNTER — Telehealth: Payer: Self-pay

## 2022-09-02 LAB — CERVICOVAGINAL ANCILLARY ONLY
Bacterial Vaginitis (gardnerella): POSITIVE — AB
Candida Glabrata: NEGATIVE
Candida Vaginitis: NEGATIVE
Chlamydia: NEGATIVE
Comment: NEGATIVE
Comment: NEGATIVE
Comment: NEGATIVE
Comment: NEGATIVE
Comment: NEGATIVE
Comment: NORMAL
Neisseria Gonorrhea: NEGATIVE
Trichomonas: POSITIVE — AB

## 2022-09-02 MED ORDER — METRONIDAZOLE 500 MG PO TABS: 500.0000 mg | ORAL_TABLET | Freq: Two times a day (BID) | ORAL | 0 refills | Status: DC

## 2022-09-02 NOTE — Telephone Encounter (Unsigned)
Copied from [place] [id number]. Topic: General - Inquiry >> Aug 30, 2022 12:27 PM Devoria Glassing wrote: Reason for CRM: pt does not want to go all weekend w/out medication.  Wants to start medication today.  If Junie Panning can go on and prescribe something, even if test does not come back today.  Pt states she is still having discharge.

## 2022-09-02 NOTE — Addendum Note (Signed)
Addended by: Talitha Givens on: 09/02/2022 02:32 PM   Modules accepted: Orders

## 2022-10-08 LAB — HM MAMMOGRAPHY

## 2022-10-30 ENCOUNTER — Encounter: Payer: Self-pay | Admitting: Internal Medicine

## 2022-11-14 LAB — RESULTS CONSOLE HPV: CHL HPV: NEGATIVE

## 2022-11-14 LAB — HM PAP SMEAR: HM Pap smear: ABNORMAL

## 2022-11-15 LAB — HEMOGLOBIN A1C: Hemoglobin A1C: 5.8

## 2022-11-15 LAB — BASIC METABOLIC PANEL: Creatinine: 1 (ref 0.5–1.1)

## 2022-11-15 LAB — LIPID PANEL
Cholesterol: 242 — AB (ref 0–200)
HDL: 55 (ref 35–70)

## 2022-11-15 LAB — COMPREHENSIVE METABOLIC PANEL: eGFR: 70

## 2022-11-21 ENCOUNTER — Encounter: Payer: Self-pay | Admitting: Internal Medicine

## 2023-02-07 ENCOUNTER — Ambulatory Visit: Payer: No Typology Code available for payment source | Admitting: Internal Medicine

## 2023-03-17 ENCOUNTER — Ambulatory Visit: Payer: No Typology Code available for payment source | Admitting: Internal Medicine

## 2023-03-17 ENCOUNTER — Encounter: Payer: Self-pay | Admitting: Internal Medicine

## 2023-03-17 VITALS — BP 108/64 | HR 75 | Temp 96.6°F | Wt 192.0 lb

## 2023-03-17 DIAGNOSIS — J301 Allergic rhinitis due to pollen: Secondary | ICD-10-CM | POA: Diagnosis not present

## 2023-03-17 MED ORDER — BENZONATATE 200 MG PO CAPS: 200.0000 mg | ORAL_CAPSULE | Freq: Three times a day (TID) | ORAL | 0 refills | Status: DC | PRN

## 2023-03-17 MED ORDER — METHYLPREDNISOLONE ACETATE 80 MG/ML IJ SUSP
80.0000 mg | Freq: Once | INTRAMUSCULAR | Status: AC
  Administered 2023-03-17: 80 mg via INTRAMUSCULAR

## 2023-03-17 NOTE — Patient Instructions (Signed)

## 2023-03-17 NOTE — Addendum Note (Signed)
Addended by: Kavin Leech E on: 03/17/2023 02:08 PM   Modules accepted: Orders

## 2023-03-17 NOTE — Progress Notes (Signed)
HPI  Pt presents to the clinic today with c/o headache, sinus pressure, runny nose, sore throat and cough.  This started 2 days ago. She is blowing light yellow mucous out of her nose. She denies difficulty swallowing. The cough is non productive. She denies nasal congestion, ear pain, shortness of breath. She has had 1 episode of diarrhea but denies nausea or vomiting. She denies fever, chills or body aches. She typically has seasonal allergies this time of year but takes Xyzal daily. She has not had sick contacts that she is aware of. She has tried Ibuprofen with minimal relief of symptoms.   Review of Systems      Past Medical History:  Diagnosis Date   Allergy    Arthritis    knees   Hemorrhoids    Hyperlipidemia     Family History  Problem Relation Age of Onset   Arthritis Mother    Hyperlipidemia Mother    Hypertension Mother    Heart disease Father    Breast cancer Paternal Aunt    Colon cancer Neg Hx    Colon polyps Neg Hx    Esophageal cancer Neg Hx    Rectal cancer Neg Hx    Stomach cancer Neg Hx     Social History   Socioeconomic History   Marital status: Married    Spouse name: Not on file   Number of children: Not on file   Years of education: Not on file   Highest education level: Not on file  Occupational History   Occupation: Psychologist, sport and exercise: [employer]  Tobacco Use   Smoking status: Never   Smokeless tobacco: Never  Substance and Sexual Activity   Alcohol use: Yes    Alcohol/week: 0.0 standard drinks of alcohol    Comment: occasional   Drug use: No   Sexual activity: Yes    Birth control/protection: Surgical  Other Topics Concern   Not on file  Social History Narrative   Not on file   Social Determinants of Health   Financial Resource Strain: Not on file  Food Insecurity: Not on file  Transportation Needs: Not on file  Physical Activity: Not on file  Stress: Not on file  Social Connections: Not on file  Intimate Partner  Violence: Not on file    Allergies  Allergen Reactions   Biaxin [Clarithromycin] Other (See Comments)    Severe abdominal cramping   Penicillins Hives     Constitutional: Positive headache. Denies fatigue, fever or abrupt weight changes.  HEENT:  Positive sinus pressure, runny nose and  sore throat. Denies eye redness, eye pain, pressure behind the eyes, facial pain, nasal congestion, ear pain, ringing in the ears, wax buildup, runny nose or bloody nose. Respiratory: Positive cough. Denies difficulty breathing or shortness of breath.  Cardiovascular: Denies chest pain, chest tightness, palpitations or swelling in the hands or feet.   No other specific complaints in a complete review of systems (except as listed in HPI above).  Objective:   BP 108/64 (BP Location: Left Arm, Patient Position: Sitting, Cuff Size: Normal)   Pulse 75   Temp (!) 96.6 F (35.9 C) (Temporal)   Wt 192 lb (87.1 kg)   SpO2 100%   BMI 34.01 kg/m   Wt Readings from Last 3 Encounters:  08/29/22 205 lb (93 kg)  04/26/22 216 lb (98 kg)  01/31/22 213 lb (96.6 kg)     General: Appears her stated age, obese, in NAD. HEENT:  Head: normal shape and size, nos sinus tenderness noted; Eyes: sclera white, no icterus, conjunctiva pink;  Nose: mucosa boggy and moist, turbinates swollen; Throat/Mouth: + PND. Teeth present, mucosa erythematous and moist, no exudate noted, no lesions or ulcerations noted.  Neck: No cervical lymphadenopathy.  Cardiovascular: Normal rate and rhythm. S1,S2 noted.  No murmur, rubs or gallops noted.  Pulmonary/Chest: Normal effort and positive vesicular breath sounds. No respiratory distress. No wheezes, rales or ronchi noted.       Assessment & Plan:   Allergic Rhinitis:  Get some rest and drink plenty of water Do salt water gargles for the sore throat 80 mg Depo Medrol IM Can start Flonase OTC Increase Xyzal to BID x 3 days then go back down to nightly No indication for abx at this  time  Schedule an appointment for your annual exam  Nicki Reaper, NP

## 2023-03-19 ENCOUNTER — Encounter: Payer: Self-pay | Admitting: Internal Medicine

## 2023-03-19 ENCOUNTER — Ambulatory Visit (INDEPENDENT_AMBULATORY_CARE_PROVIDER_SITE_OTHER): Payer: No Typology Code available for payment source | Admitting: Internal Medicine

## 2023-03-19 VITALS — BP 122/68 | HR 102 | Temp 97.6°F | Resp 18 | Wt 190.8 lb

## 2023-03-19 DIAGNOSIS — R7303 Prediabetes: Secondary | ICD-10-CM

## 2023-03-19 DIAGNOSIS — J329 Chronic sinusitis, unspecified: Secondary | ICD-10-CM

## 2023-03-19 DIAGNOSIS — Z1159 Encounter for screening for other viral diseases: Secondary | ICD-10-CM

## 2023-03-19 DIAGNOSIS — E66811 Obesity, class 1: Secondary | ICD-10-CM | POA: Insufficient documentation

## 2023-03-19 DIAGNOSIS — Z0001 Encounter for general adult medical examination with abnormal findings: Secondary | ICD-10-CM

## 2023-03-19 DIAGNOSIS — E7800 Pure hypercholesterolemia, unspecified: Secondary | ICD-10-CM

## 2023-03-19 DIAGNOSIS — Z6833 Body mass index (BMI) 33.0-33.9, adult: Secondary | ICD-10-CM

## 2023-03-19 DIAGNOSIS — G4700 Insomnia, unspecified: Secondary | ICD-10-CM | POA: Insufficient documentation

## 2023-03-19 DIAGNOSIS — B9789 Other viral agents as the cause of diseases classified elsewhere: Secondary | ICD-10-CM

## 2023-03-19 DIAGNOSIS — J302 Other seasonal allergic rhinitis: Secondary | ICD-10-CM

## 2023-03-19 DIAGNOSIS — E6609 Other obesity due to excess calories: Secondary | ICD-10-CM | POA: Insufficient documentation

## 2023-03-19 MED ORDER — PREDNISONE 10 MG PO TABS: ORAL_TABLET | ORAL | 0 refills | Status: DC

## 2023-03-19 NOTE — Patient Instructions (Signed)
Health Maintenance for Postmenopausal Women Menopause is a normal process in which your ability to get pregnant comes to an end. This process happens slowly over many months or years, usually between the ages of 48 and 55. Menopause is complete when you have missed your menstrual period for 12 months. It is important to talk with your health care provider about some of the most common conditions that affect women after menopause (postmenopausal women). These include heart disease, cancer, and bone loss (osteoporosis). Adopting a healthy lifestyle and getting preventive care can help to promote your health and wellness. The actions you take can also lower your chances of developing some of these common conditions. What are the signs and symptoms of menopause? During menopause, you may have the following symptoms: Hot flashes. These can be moderate or severe. Night sweats. Decrease in sex drive. Mood swings. Headaches. Tiredness (fatigue). Irritability. Memory problems. Problems falling asleep or staying asleep. Talk with your health care provider about treatment options for your symptoms. Do I need hormone replacement therapy? Hormone replacement therapy is effective in treating symptoms that are caused by menopause, such as hot flashes and night sweats. Hormone replacement carries certain risks, especially as you become older. If you are thinking about using estrogen or estrogen with progestin, discuss the benefits and risks with your health care provider. How can I reduce my risk for heart disease and stroke? The risk of heart disease, heart attack, and stroke increases as you age. One of the causes may be a change in the body's hormones during menopause. This can affect how your body uses dietary fats, triglycerides, and cholesterol. Heart attack and stroke are medical emergencies. There are many things that you can do to help prevent heart disease and stroke. Watch your blood pressure High  blood pressure causes heart disease and increases the risk of stroke. This is more likely to develop in people who have high blood pressure readings or are overweight. Have your blood pressure checked: Every 3-5 years if you are 18-39 years of age. Every year if you are 40 years old or older. Eat a healthy diet  Eat a diet that includes plenty of vegetables, fruits, low-fat dairy products, and lean protein. Do not eat a lot of foods that are high in solid fats, added sugars, or sodium. Get regular exercise Get regular exercise. This is one of the most important things you can do for your health. Most adults should: Try to exercise for at least 150 minutes each week. The exercise should increase your heart rate and make you sweat (moderate-intensity exercise). Try to do strengthening exercises at least twice each week. Do these in addition to the moderate-intensity exercise. Spend less time sitting. Even light physical activity can be beneficial. Other tips Work with your health care provider to achieve or maintain a healthy weight. Do not use any products that contain nicotine or tobacco. These products include cigarettes, chewing tobacco, and vaping devices, such as e-cigarettes. If you need help quitting, ask your health care provider. Know your numbers. Ask your health care provider to check your cholesterol and your blood sugar (glucose). Continue to have your blood tested as directed by your health care provider. Do I need screening for cancer? Depending on your health history and family history, you may need to have cancer screenings at different stages of your life. This may include screening for: Breast cancer. Cervical cancer. Lung cancer. Colorectal cancer. What is my risk for osteoporosis? After menopause, you may be   at increased risk for osteoporosis. Osteoporosis is a condition in which bone destruction happens more quickly than new bone creation. To help prevent osteoporosis or  the bone fractures that can happen because of osteoporosis, you may take the following actions: If you are 19-50 years old, get at least 1,000 mg of calcium and at least 600 international units (IU) of vitamin D per day. If you are older than age 50 but younger than age 70, get at least 1,200 mg of calcium and at least 600 international units (IU) of vitamin D per day. If you are older than age 70, get at least 1,200 mg of calcium and at least 800 international units (IU) of vitamin D per day. Smoking and drinking excessive alcohol increase the risk of osteoporosis. Eat foods that are rich in calcium and vitamin D, and do weight-bearing exercises several times each week as directed by your health care provider. How does menopause affect my mental health? Depression may occur at any age, but it is more common as you become older. Common symptoms of depression include: Feeling depressed. Changes in sleep patterns. Changes in appetite or eating patterns. Feeling an overall lack of motivation or enjoyment of activities that you previously enjoyed. Frequent crying spells. Talk with your health care provider if you think that you are experiencing any of these symptoms. General instructions See your health care provider for regular wellness exams and vaccines. This may include: Scheduling regular health, dental, and eye exams. Getting and maintaining your vaccines. These include: Influenza vaccine. Get this vaccine each year before the flu season begins. Pneumonia vaccine. Shingles vaccine. Tetanus, diphtheria, and pertussis (Tdap) booster vaccine. Your health care provider may also recommend other immunizations. Tell your health care provider if you have ever been abused or do not feel safe at home. Summary Menopause is a normal process in which your ability to get pregnant comes to an end. This condition causes hot flashes, night sweats, decreased interest in sex, mood swings, headaches, or lack  of sleep. Treatment for this condition may include hormone replacement therapy. Take actions to keep yourself healthy, including exercising regularly, eating a healthy diet, watching your weight, and checking your blood pressure and blood sugar levels. Get screened for cancer and depression. Make sure that you are up to date with all your vaccines. This information is not intended to replace advice given to you by your health care provider. Make sure you discuss any questions you have with your health care provider. Document Revised: 03/26/2021 Document Reviewed: 03/26/2021 Elsevier Patient Education  2023 [employer]  

## 2023-03-19 NOTE — Progress Notes (Signed)
Subjective:    Patient ID: Amy Grimes, female    DOB: 14-Jul-1966, 57 y.o.   MRN: [id number]  HPI  Patient presents to clinic today for her annual exam.  Flu: 08/2022 Tetanus: 11/2016 COVID: x 3 Shingrix: Never Pap smear: 10/2022, [facility] Mammogram: 10/2022 Colon screening: 01/2017 Vision screening: annually Dentist: biannually  Diet: She does eat lean meat. She consumes fruits and veggies. She tries to avoid fried foods. She drinks mostly water. Exercise: Walking  Review of Systems     Past Medical History:  Diagnosis Date   Allergy    Arthritis    knees   Hemorrhoids    Hyperlipidemia     Current Outpatient Medications  Medication Sig Dispense Refill   benzonatate (TESSALON) 200 MG capsule Take 1 capsule (200 mg total) by mouth 3 (three) times daily as needed. 30 capsule 0   Semaglutide-Weight Management (WEGOVY) 2.4 MG/0.75ML SOAJ Inject 2.4 mg into the skin once a week.     zolpidem (AMBIEN) 10 MG tablet Take 10 mg by mouth at bedtime.     No current facility-administered medications for this visit.    Allergies  Allergen Reactions   Biaxin [Clarithromycin] Other (See Comments)    Severe abdominal cramping   Penicillins Hives    Family History  Problem Relation Age of Onset   Arthritis Mother    Hyperlipidemia Mother    Hypertension Mother    Heart disease Father    Breast cancer Paternal Aunt    Colon cancer Neg Hx    Colon polyps Neg Hx    Esophageal cancer Neg Hx    Rectal cancer Neg Hx    Stomach cancer Neg Hx     Social History   Socioeconomic History   Marital status: Married    Spouse name: Not on file   Number of children: Not on file   Years of education: Not on file   Highest education level: Not on file  Occupational History   Occupation: Psychologist, sport and exercise: [employer]  Tobacco Use   Smoking status: Never   Smokeless tobacco: Never  Substance and Sexual Activity   Alcohol use: Yes    Alcohol/week: 0.0  standard drinks of alcohol    Comment: occasional   Drug use: No   Sexual activity: Yes    Birth control/protection: Surgical  Other Topics Concern   Not on file  Social History Narrative   Not on file   Social Determinants of Health   Financial Resource Strain: Not on file  Food Insecurity: Not on file  Transportation Needs: Not on file  Physical Activity: Not on file  Stress: Not on file  Social Connections: Not on file  Intimate Partner Violence: Not on file     Constitutional: Denies fever, malaise, fatigue, headache or abrupt weight changes.  HEENT: Patient reports postnasal drip.  Denies eye pain, eye redness, ear pain, ringing in the ears, wax buildup, runny nose, nasal congestion, bloody nose, or sore throat. Respiratory: Patient reports cough.  Denies difficulty breathing, shortness of breath, or sputum production.   Cardiovascular: Denies chest pain, chest tightness, palpitations or swelling in the hands or feet.  Gastrointestinal: Denies abdominal pain, bloating, constipation, diarrhea or blood in the stool.  GU: Denies urgency, frequency, pain with urination, burning sensation, blood in urine, odor or discharge. Musculoskeletal: Denies decrease in range of motion, difficulty with gait, muscle pain or joint pain and swelling.  Skin: Patient reports hypopigmented skin  lesions of BLE.  Denies redness, rashes, or ulcercations.  Neurological: Patient reports insomnia.  Denies dizziness, difficulty with memory, difficulty with speech or problems with balance and coordination.  Psych: Denies anxiety, depression, SI/HI.  No other specific complaints in a complete review of systems (except as listed in HPI above).  Objective:   Physical Exam   BP 122/68   Pulse (!) 102   Temp 97.6 F (36.4 C)   Resp 18   Wt 190 lb 12.8 oz (86.5 kg)   SpO2 99%   BMI 33.80 kg/m   Wt Readings from Last 3 Encounters:  03/17/23 192 lb (87.1 kg)  08/29/22 205 lb (93 kg)  04/26/22 216  lb (98 kg)    General: Appears her stated age, obese, in NAD. Skin: Warm, dry and intact.  Scattered, hyperpigmented lesions noted of BLE likely represent scarring. HEENT: Head: normal shape and size; Eyes: sclera white, no icterus, conjunctiva pink, PERRLA and EOMs intact;  Neck:  Neck supple, trachea midline. No masses, lumps or thyromegaly present.  Cardiovascular: Normal rate and rhythm. S1,S2 noted.  No murmur, rubs or gallops noted. No JVD or BLE edema. No carotid bruits noted. Pulmonary/Chest: Normal effort and positive vesicular breath sounds. No respiratory distress. No wheezes, rales or ronchi noted.  Abdomen: Normal bowel sounds.  Musculoskeletal: Strength 5/5 BUE/BLE.  No difficulty with gait.  Neurological: Alert and oriented. Cranial nerves II-XII grossly intact. Coordination normal.  Psychiatric: Mood and affect normal. Behavior is normal. Judgment and thought content normal.     BMET    Component Value Date/Time   NA 137 12/16/2016 1522   K 3.9 12/16/2016 1522   CL 104 12/16/2016 1522   CO2 27 12/16/2016 1522   GLUCOSE 99 12/16/2016 1522   BUN 16 12/16/2016 1522   CREATININE 1.0 11/15/2022 0000   CREATININE 1.05 12/16/2016 1522   CALCIUM 9.1 12/16/2016 1522    Lipid Panel     Component Value Date/Time   CHOL 242 (A) 11/15/2022 0000   TRIG 73.0 12/16/2016 1522   HDL 55 11/15/2022 0000   CHOLHDL 4 12/16/2016 1522   VLDL 14.6 12/16/2016 1522   LDLCALC 138 (H) 12/16/2016 1522    CBC    Component Value Date/Time   WBC 8.3 12/16/2016 1522   RBC 4.61 12/16/2016 1522   HGB 12.4 12/16/2016 1522   HCT 37.9 12/16/2016 1522   PLT 435.0 (H) 12/16/2016 1522   MCV 82.3 12/16/2016 1522   MCHC 32.7 12/16/2016 1522   RDW 14.8 12/16/2016 1522    Hgb A1C Lab Results  Component Value Date   HGBA1C 5.8 11/15/2022           Assessment & Plan:   Preventative Health Maintenance:  Encouraged her to get a flu shot in the fall Tetanus UTD Encouraged her to  get her COVID booster Discussed Shingrix vaccine, she will check coverage with her insurance company and schedule visit if she would like to have this done Pap smear UTD, request copy Mammogram UTD, request copy Colon screening UTD Encouraged her to consume a balanced diet and exercise regimen Advised her to see an eye doctor and dentist annually Will check CBC, CMET, lipid, A1c and hep C today  Viral Sinusitis, Seasonal Allergies:  Rx for Pred taper x 6 days Referral to allergy for allergy testing RTC in 6 months, follow-up chronic conditions Nicki Reaper, NP

## 2023-03-19 NOTE — Assessment & Plan Note (Signed)
Encourage diet and exercise for weight loss 

## 2023-03-20 LAB — LIPID PANEL
Cholesterol: 202 mg/dL — ABNORMAL HIGH (ref <200)
HDL: 49 mg/dL — ABNORMAL LOW (ref >=50)
LDL Cholesterol (Calc): 136 mg/dL (calc) — ABNORMAL HIGH
Non-HDL Cholesterol (Calc): 153 mg/dL (calc) — ABNORMAL HIGH (ref <130)
Total CHOL/HDL Ratio: 4.1 (calc) (ref <5.0)
Triglycerides: 75 mg/dL (ref <150)

## 2023-03-20 LAB — COMPLETE METABOLIC PANEL WITH GFR
AG Ratio: 1.3 (calc) (ref 1.0–2.5)
ALT: 11 U/L (ref 6–29)
AST: 13 U/L (ref 10–35)
Albumin: 3.9 g/dL (ref 3.6–5.1)
Alkaline phosphatase (APISO): 81 U/L (ref 37–153)
BUN/Creatinine Ratio: 10 (calc) (ref 6–22)
BUN: 11 mg/dL (ref 7–25)
CO2: 24 mmol/L (ref 20–32)
Calcium: 9.2 mg/dL (ref 8.6–10.4)
Chloride: 107 mmol/L (ref 98–110)
Creat: 1.11 mg/dL — ABNORMAL HIGH (ref 0.50–1.03)
Globulin: 3 g/dL (calc) (ref 1.9–3.7)
Glucose, Bld: 87 mg/dL (ref 65–139)
Potassium: 4.3 mmol/L (ref 3.5–5.3)
Sodium: 141 mmol/L (ref 135–146)
Total Bilirubin: 0.2 mg/dL (ref 0.2–1.2)
Total Protein: 6.9 g/dL (ref 6.1–8.1)
eGFR: 58 mL/min/{1.73_m2} — ABNORMAL LOW (ref >=60)

## 2023-03-20 LAB — CBC
HCT: 38.4 % (ref 35.0–45.0)
Hemoglobin: 12.3 g/dL (ref 11.7–15.5)
MCH: 26.2 pg — ABNORMAL LOW (ref 27.0–33.0)
MCHC: 32 g/dL (ref 32.0–36.0)
MCV: 81.9 fL (ref 80.0–100.0)
MPV: 10.2 fL (ref 7.5–12.5)
Platelets: 389 10*3/uL (ref 140–400)
RBC: 4.69 10*6/uL (ref 3.80–5.10)
RDW: 13.8 % (ref 11.0–15.0)
WBC: 5.7 10*3/uL (ref 3.8–10.8)

## 2023-03-20 LAB — HEMOGLOBIN A1C
Hgb A1c MFr Bld: 5.9 % of total Hgb — ABNORMAL HIGH (ref <5.7)
Mean Plasma Glucose: 123 mg/dL
eAG (mmol/L): 6.8 mmol/L

## 2023-03-20 LAB — HEPATITIS C ANTIBODY: Hepatitis C Ab: NONREACTIVE

## 2023-03-26 ENCOUNTER — Encounter: Payer: Self-pay | Admitting: Internal Medicine

## 2023-04-09 ENCOUNTER — Encounter: Payer: Self-pay | Admitting: Internal Medicine

## 2023-08-25 ENCOUNTER — Ambulatory Visit: Payer: Self-pay

## 2023-08-25 NOTE — Telephone Encounter (Signed)
      Chief Complaint: Neck, shoulder pain. Disturbs sleep. Symptoms: Above Frequency: 2 weeks ago Pertinent Negatives: Patient denies  Disposition: [] ED /[] Urgent Care (no appt availability in office) / [x] Appointment(In office/virtual)/ []  [facility]/ [] Home Care/ [] Refused Recommended Disposition /[]  Mobile Bus/ []  Follow-up with PCP Additional Notes: Pt. Agrees with appointment.  Reason for Disposition  [1] MODERATE neck pain (e.g., interferes with normal activities) AND [2] present > 3 days  Answer Assessment - Initial Assessment Questions 1. ONSET: "When did the pain begin?"      2 weeks ago 2. LOCATION: "Where does it hurt?"      Back of neck 3. PATTERN "Does the pain come and go, or has it been constant since it started?"      Comes and goes 4. SEVERITY: "How bad is the pain?"  (Scale 1-10; or mild, moderate, severe)   - NO PAIN (0): no pain or only slight stiffness    - MILD (1-3): doesn't interfere with normal activities    - MODERATE (4-7): interferes with normal activities or awakens from sleep    - SEVERE (8-10):  excruciating pain, unable to do any normal activities      7 5. RADIATION: "Does the pain go anywhere else, shoot into your arms?"     Shoulder, back 6. CORD SYMPTOMS: "Any weakness or numbness of the arms or legs?"     No 7. CAUSE: "What do you think is causing the neck pain?"     Unsure 8. NECK OVERUSE: "Any recent activities that involved turning or twisting the neck?"     No 9. OTHER SYMPTOMS: "Do you have any other symptoms?" (e.g., headache, fever, chest pain, difficulty breathing, neck swelling)     No 10. PREGNANCY: "Is there any chance you are pregnant?" "When was your last menstrual period?"       No  Protocols used: Neck Pain or Stiffness-A-AH

## 2023-08-26 ENCOUNTER — Ambulatory Visit: Payer: No Typology Code available for payment source | Admitting: Internal Medicine

## 2023-08-26 ENCOUNTER — Other Ambulatory Visit: Payer: Self-pay | Admitting: Internal Medicine

## 2023-08-26 ENCOUNTER — Encounter: Payer: Self-pay | Admitting: Internal Medicine

## 2023-08-26 VITALS — BP 126/66 | HR 84 | Temp 96.8°F | Wt 188.0 lb

## 2023-08-26 DIAGNOSIS — Z6833 Body mass index (BMI) 33.0-33.9, adult: Secondary | ICD-10-CM

## 2023-08-26 DIAGNOSIS — Z23 Encounter for immunization: Secondary | ICD-10-CM

## 2023-08-26 DIAGNOSIS — R7303 Prediabetes: Secondary | ICD-10-CM | POA: Diagnosis not present

## 2023-08-26 DIAGNOSIS — E7800 Pure hypercholesterolemia, unspecified: Secondary | ICD-10-CM

## 2023-08-26 DIAGNOSIS — M542 Cervicalgia: Secondary | ICD-10-CM

## 2023-08-26 DIAGNOSIS — F5101 Primary insomnia: Secondary | ICD-10-CM

## 2023-08-26 DIAGNOSIS — E66811 Obesity, class 1: Secondary | ICD-10-CM

## 2023-08-26 DIAGNOSIS — E6609 Other obesity due to excess calories: Secondary | ICD-10-CM

## 2023-08-26 MED ORDER — METHOCARBAMOL 500 MG PO TABS: 500.0000 mg | ORAL_TABLET | Freq: Three times a day (TID) | ORAL | 0 refills | Status: DC | PRN

## 2023-08-26 NOTE — Assessment & Plan Note (Signed)
Continue Ambien as needed 

## 2023-08-26 NOTE — Assessment & Plan Note (Signed)
She reports she recently had an A1c, will send the results through MyChart Encourage low-carb diet and exercise for weight loss

## 2023-08-26 NOTE — Patient Instructions (Signed)
Neck Exercises Ask your health care provider which exercises are safe for you. Do exercises exactly as told by your health care provider and adjust them as directed. It is normal to feel mild stretching, pulling, tightness, or discomfort as you do these exercises. Stop right away if you feel sudden pain or your pain gets worse. Do not begin these exercises until told by your health care provider. Neck exercises can be important for many reasons. They can improve strength and maintain flexibility in your neck, which will help your upper back and prevent neck pain. Stretching exercises Rotation neck stretching  Sit in a chair or stand up. Place your feet flat on the floor, shoulder-width apart. Slowly turn your head (rotate) to the right until a slight stretch is felt. Turn it all the way to the right so you can look over your right shoulder. Do not tilt or tip your head. Hold this position for 10-30 seconds. Slowly turn your head (rotate) to the left until a slight stretch is felt. Turn it all the way to the left so you can look over your left shoulder. Do not tilt or tip your head. Hold this position for 10-30 seconds. Repeat __________ times. Complete this exercise __________ times a day. Neck retraction  Sit in a sturdy chair or stand up. Look straight ahead. Do not bend your neck. Use your fingers to push your chin backward (retraction). Do not bend your neck for this movement. Continue to face straight ahead. If you are doing the exercise properly, you will feel a slight sensation in your throat and a stretch at the back of your neck. Hold the stretch for 1-2 seconds. Repeat __________ times. Complete this exercise __________ times a day. Strengthening exercises Neck press  Lie on your back on a firm bed or on the floor with a pillow under your head. Use your neck muscles to push your head down on the pillow and straighten your spine. Hold the position as well as you can. Keep your head  facing up (in a neutral position) and your chin tucked. Slowly count to 5 while holding this position. Repeat __________ times. Complete this exercise __________ times a day. Isometrics These are exercises in which you strengthen the muscles in your neck while keeping your neck still (isometrics). Sit in a supportive chair and place your hand on your forehead. Keep your head and face facing straight ahead. Do not flex or extend your neck while doing isometrics. Push forward with your head and neck while pushing back with your hand. Hold for 10 seconds. Do the sequence again, this time putting your hand against the back of your head. Use your head and neck to push backward against the hand pressure. Finally, do the same exercise on either side of your head, pushing sideways against the pressure of your hand. Repeat __________ times. Complete this exercise __________ times a day. Prone head lifts  Lie face-down (prone position), resting on your elbows so that your chest and upper back are raised. Start with your head facing downward, near your chest. Position your chin either on or near your chest. Slowly lift your head upward. Lift until you are looking straight ahead. Then continue lifting your head as far back as you can comfortably stretch. Hold your head up for 5 seconds. Then slowly lower it to your starting position. Repeat __________ times. Complete this exercise __________ times a day. Supine head lifts  Lie on your back (supine position), bending your knees   to point to the ceiling and keeping your feet flat on the floor. Lift your head slowly off the floor, raising your chin toward your chest. Hold for 5 seconds. Repeat __________ times. Complete this exercise __________ times a day. Scapular retraction  Stand with your arms at your sides. Look straight ahead. Slowly pull both shoulders (scapulae) backward and downward (retraction) until you feel a stretch between your shoulder  blades in your upper back. Hold for 10-30 seconds. Relax and repeat. Repeat __________ times. Complete this exercise __________ times a day. Contact a health care provider if: Your neck pain or discomfort gets worse when you do an exercise. Your neck pain or discomfort does not improve within 2 hours after you exercise. If you have any of these problems, stop exercising right away. Do not do the exercises again unless your health care provider says that you can. Get help right away if: You develop sudden, severe neck pain. If this happens, stop exercising right away. Do not do the exercises again unless your health care provider says that you can. This information is not intended to replace advice given to you by your health care provider. Make sure you discuss any questions you have with your health care provider. Document Revised: 05/01/2021 Document Reviewed: 05/01/2021 Elsevier Patient Education  2024 [employer]  

## 2023-08-26 NOTE — Progress Notes (Signed)
Subjective:    Patient ID: Amy Grimes, female    DOB: [birth year], 57 y.o.   MRN: [id number]  HPI  Patient presents to clinic today for follow-up of chronic conditions.  Insomnia: She has difficulty falling asleep.  She takes [place] as needed with good relief of symptoms.  There is no sleep study on file.  HLD: Her last LDL was 136, triglycerides 75, 03/2023.  She is not taking any cholesterol-lowering medication at this time.  She tries to consume a low-fat diet.  Prediabetes: Her last A1c was 5.9%, 03/2023.  She is not taking any oral diabetic medication at this time.  She does not check her sugars.  CKD 3: Her last creatinine was 1.11, GFR 58, 03/2023.  She is not currently on an ACEI/ARB.  She does not follow with nephrology.  She also reports right shoulder/neck pain  This started 2 weeks ago.  She describes the pain as sharp and stabbing. The pain is worse with certain movement. The pain does not radiate down her right arm. She has tried biofreeze and ibuprofen as needed with some relief of symptoms.  Review of Systems     Past Medical History:  Diagnosis Date   Allergy    Arthritis    knees   Hemorrhoids    Hyperlipidemia     Current Outpatient Medications  Medication Sig Dispense Refill   benzonatate (TESSALON) 200 MG capsule Take 1 capsule (200 mg total) by mouth 3 (three) times daily as needed. 30 capsule 0   predniSONE (DELTASONE) 10 MG tablet Take 6 tabs on day 1, 5 tabs on day 2, 4 tabs on day 3, 3 tabs on day 4, 2 tabs on day 5, 1 tab on day 6 21 tablet 0   Semaglutide-Weight Management (WEGOVY) 2.4 MG/0.75ML SOAJ Inject 2.4 mg into the skin once a week.     zolpidem (AMBIEN) 10 MG tablet Take 10 mg by mouth at bedtime.     No current facility-administered medications for this visit.    Allergies  Allergen Reactions   Biaxin [Clarithromycin] Other (See Comments)    Severe abdominal cramping   Penicillins Hives    Family History  Problem Relation Age of  Onset   Arthritis Mother    Hyperlipidemia Mother    Hypertension Mother    Heart disease Father    Breast cancer Paternal Aunt    Colon cancer Neg Hx    Colon polyps Neg Hx    Esophageal cancer Neg Hx    Rectal cancer Neg Hx    Stomach cancer Neg Hx     Social History   Socioeconomic History   Marital status: Married    Spouse name: Not on file   Number of children: Not on file   Years of education: Not on file   Highest education level: Not on file  Occupational History   Occupation: Psychologist, sport and exercise: [employer]  Tobacco Use   Smoking status: Never   Smokeless tobacco: Never  Substance and Sexual Activity   Alcohol use: Yes    Alcohol/week: 0.0 standard drinks of alcohol    Comment: occasional   Drug use: No   Sexual activity: Yes    Birth control/protection: Surgical  Other Topics Concern   Not on file  Social History Narrative   Not on file   Social Determinants of Health   Financial Resource Strain: Not on file  Food Insecurity: Not on file  Transportation Needs:  Not on file  Physical Activity: Not on file  Stress: Not on file  Social Connections: Not on file  Intimate Partner Violence: Not on file     Constitutional: Denies fever, malaise, fatigue, headache or abrupt weight changes.  HEENT: Denies eye pain, eye redness, ear pain, ringing in the ears, wax buildup, runny nose, nasal congestion, bloody nose, or sore throat. Respiratory: Denies difficulty breathing, shortness of breath, cough or sputum production.   Cardiovascular: Denies chest pain, chest tightness, palpitations or swelling in the hands or feet.  Gastrointestinal: Denies abdominal pain, bloating, constipation, diarrhea or blood in the stool.  GU: Denies urgency, frequency, pain with urination, burning sensation, blood in urine, odor or discharge. Musculoskeletal: Patient reports right side neck/shoulder pain.  Denies difficulty with gait, or joint swelling.  Skin: Denies redness,  rashes, lesions or ulcercations.  Neurological: Patient reports insomnia.  Denies dizziness, difficulty with memory, difficulty with speech or problems with balance and coordination.  Psych: Denies anxiety, depression, SI/HI.  No other specific complaints in a complete review of systems (except as listed in HPI above).  Objective:   Physical Exam  BP 126/66 (BP Location: Left Arm, Patient Position: Sitting, Cuff Size: Normal)   Pulse 84   Temp (!) 96.8 F (36 C) (Temporal)   Wt 188 lb (85.3 kg)   SpO2 98%   BMI 33.30 kg/m   Wt Readings from Last 3 Encounters:  03/19/23 190 lb 12.8 oz (86.5 kg)  03/17/23 192 lb (87.1 kg)  08/29/22 205 lb (93 kg)    General: Appears her stated age, obese, in NAD. Skin: Warm, dry and intact.  HEENT: Head: normal shape and size; Eyes: sclera white, no icterus, conjunctiva pink, PERRLA and EOMs intact;  Cardiovascular: Normal rate and rhythm. S1,S2 noted.  No murmur, rubs or gallops noted. No JVD or BLE edema. No carotid bruits noted. Pulmonary/Chest: Normal effort and positive vesicular breath sounds. No respiratory distress. No wheezes, rales or ronchi noted.  Musculoskeletal: Decreased flexion, rotation and lateral bending to the left of the cervical spine.  No bony tenderness noted over the cervical spine.  Pain with palpation of bilateral paracervical muscles.  Shoulder shrug equal.  Normal internal and external rotation of the right shoulder.  No difficulty with gait.  Neurological: Alert and oriented.  Coordination normal.    BMET    Component Value Date/Time   NA 141 03/19/2023 1508   K 4.3 03/19/2023 1508   CL 107 03/19/2023 1508   CO2 24 03/19/2023 1508   GLUCOSE 87 03/19/2023 1508   BUN 11 03/19/2023 1508   CREATININE 1.11 (H) 03/19/2023 1508   CALCIUM 9.2 03/19/2023 1508    Lipid Panel     Component Value Date/Time   CHOL 202 (H) 03/19/2023 1508   TRIG 75 03/19/2023 1508   HDL 49 (L) 03/19/2023 1508   CHOLHDL 4.1 03/19/2023  1508   VLDL 14.6 12/16/2016 1522   LDLCALC 136 (H) 03/19/2023 1508    CBC    Component Value Date/Time   WBC 5.7 03/19/2023 1508   RBC 4.69 03/19/2023 1508   HGB 12.3 03/19/2023 1508   HCT 38.4 03/19/2023 1508   PLT 389 03/19/2023 1508   MCV 81.9 03/19/2023 1508   MCH 26.2 (L) 03/19/2023 1508   MCHC 32.0 03/19/2023 1508   RDW 13.8 03/19/2023 1508    Hgb A1C Lab Results  Component Value Date   HGBA1C 5.9 (H) 03/19/2023  Assessment & Plan:   Acute Neck Pain:  No indication for x-ray at this time Encouraged heat and massage Continue ibuprofen OTC Rx for methocarbamol 500 mg every 8 hours as needed-sedation caution given  RTC in 7 months for annual exam Nicki Reaper, NP

## 2023-08-26 NOTE — Assessment & Plan Note (Signed)
She reports she recently had lipid profile, will send me a copy through MyChart Encouraged her to consume a low-fat diet

## 2023-08-26 NOTE — Assessment & Plan Note (Signed)
Encouraged diet and exercise for weight loss ?

## 2023-08-27 ENCOUNTER — Telehealth: Payer: Self-pay

## 2023-08-27 ENCOUNTER — Ambulatory Visit: Payer: Self-pay | Admitting: *Deleted

## 2023-08-27 ENCOUNTER — Other Ambulatory Visit: Payer: Self-pay

## 2023-08-27 DIAGNOSIS — E7800 Pure hypercholesterolemia, unspecified: Secondary | ICD-10-CM

## 2023-08-27 MED ORDER — ZOLPIDEM TARTRATE 10 MG PO TABS
10.0000 mg | ORAL_TABLET | Freq: Every day | ORAL | 0 refills | Status: DC
  Filled 2023-08-27: qty 30, 30d supply, fill #0

## 2023-08-27 NOTE — Telephone Encounter (Signed)
LDL 188, very elevated.  Would recommend atorvastatin 20 mg daily.  Please let me know if she is agreeable and I can send this in.

## 2023-08-27 NOTE — Telephone Encounter (Signed)
Opened chart by mistake.

## 2023-08-27 NOTE — Telephone Encounter (Signed)
Copied from CRM [id number]. Topic: General - Other >> Aug 27, 2023 10:12 AM [name] wrote: Reason for CRM: Pt sent over a mycart request to her PCP requesting a refill for zolpidem (AMBIEN) 10 MG tablet. Per pt please disregard, she does not need the refill.

## 2023-08-27 NOTE — Telephone Encounter (Signed)
LMTCB 08/27/2023.  PEC Please advise pt when she calls back.   Thanks,   -Vernona Rieger

## 2023-08-27 NOTE — Telephone Encounter (Signed)
Pt called back to verify the fax number for the office [phone]) Pt states every time she sends over her fax it is showing not in service. I called the office and spoke with Rachell, she provided her email to give to the pt for her to send in her documents and she will give it to her PCP. I provided the pt Rachell email, pt states that she will send the email.  [email or web address]

## 2023-08-27 NOTE — Telephone Encounter (Signed)
Pt returned call.   She is agreeable to taking the Atorvastatin.   Please send [facility] to [facility] in [place].

## 2023-08-27 NOTE — Telephone Encounter (Signed)
Copied from CRM [id number]. Topic: General - Inquiry >> Aug 27, 2023  8:55 AM De Blanch wrote: Reason for CRM:Pt stated she was sending a fax with her labs done by her employer. Stated Ms. Sampson Si is aware. Requesting a callback after she reviews as they discussed the possibility of cholesterol medication.  Please advise.

## 2023-08-28 MED ORDER — ATORVASTATIN CALCIUM 20 MG PO TABS: 20.0000 mg | ORAL_TABLET | Freq: Every day | ORAL | 1 refills | Status: DC

## 2023-08-28 NOTE — Addendum Note (Signed)
Addended by: Lorre Munroe on: 08/28/2023 09:05 AM   Modules accepted: Orders

## 2023-08-28 NOTE — Telephone Encounter (Signed)
Atorvastatin sent to pharmacy.  Please have her schedule a lab only appointment in 3 months to recheck cholesterol

## 2023-10-14 LAB — HM MAMMOGRAPHY

## 2023-10-15 ENCOUNTER — Encounter: Payer: Self-pay | Admitting: Internal Medicine

## 2023-11-21 ENCOUNTER — Other Ambulatory Visit: Payer: Self-pay | Admitting: Internal Medicine

## 2023-11-23 ENCOUNTER — Other Ambulatory Visit: Payer: Self-pay | Admitting: Internal Medicine

## 2023-11-23 ENCOUNTER — Other Ambulatory Visit: Payer: Self-pay

## 2023-11-24 ENCOUNTER — Other Ambulatory Visit: Payer: Self-pay

## 2023-11-24 MED FILL — Zolpidem Tartrate Tab 10 MG: ORAL | 25 days supply | Qty: 15 | Fill #0 | Status: AC

## 2023-11-24 NOTE — Telephone Encounter (Signed)
 Requested medication (s) are due for refill today - yes  Requested medication (s) are on the active medication list -yes  Future visit scheduled -no  Last refill: 08/27/23 #30  Notes to clinic: non delegated Rx  Requested Prescriptions  Pending Prescriptions Disp Refills   zolpidem  (AMBIEN ) 10 MG tablet 30 tablet 0    Sig: Take 1 tablet (10 mg total) by mouth at bedtime.     Not Delegated - Psychiatry:  Anxiolytics/Hypnotics Failed - 11/24/2023 12:42 PM      Failed - This refill cannot be delegated      Failed - Urine Drug Screen completed in last 360 days      Passed - Valid encounter within last 6 months    Recent Outpatient Visits           3 months ago Class 1 obesity due to excess calories with serious comorbidity and body mass index (BMI) of 33.0 to 33.9 in adult   [facility] [place], Angeline ORN, NP   8 months ago Encounter for general adult medical examination with abnormal findings   [facility] [place], [place], NP   8 months ago Seasonal allergic rhinitis due to pollen   [facility] [place], Angeline ORN, NP   1 year ago Acute pain of right shoulder   [facility] [place], [place], [place]   1 year ago Drug-induced constipation   [facility] [place], Angeline ORN, NP                 Requested Prescriptions  Pending Prescriptions Disp Refills   zolpidem  (AMBIEN ) 10 MG tablet 30 tablet 0    Sig: Take 1 tablet (10 mg total) by mouth at bedtime.     Not Delegated - Psychiatry:  Anxiolytics/Hypnotics Failed - 11/24/2023 12:42 PM      Failed - This refill cannot be delegated      Failed - Urine Drug Screen completed in last 360 days      Passed - Valid encounter within last 6 months    Recent Outpatient Visits           3 months ago Class 1 obesity due to excess calories with serious comorbidity and body mass index (BMI) of 33.0 to 33.9 in  adult   [facility] [place], Angeline ORN, NP   8 months ago Encounter for general adult medical examination with abnormal findings   [facility] [place], [place], NP   8 months ago Seasonal allergic rhinitis due to pollen   [facility] [place], Angeline ORN, NP   1 year ago Acute pain of right shoulder   [facility] [place], Rocky [place], [place]   1 year ago Drug-induced constipation   [facility] [place], Angeline ORN, [place]

## 2023-11-25 ENCOUNTER — Other Ambulatory Visit: Payer: Self-pay

## 2023-12-17 ENCOUNTER — Other Ambulatory Visit: Payer: No Typology Code available for payment source

## 2023-12-17 ENCOUNTER — Ambulatory Visit: Payer: No Typology Code available for payment source | Admitting: Internal Medicine

## 2023-12-17 DIAGNOSIS — E7800 Pure hypercholesterolemia, unspecified: Secondary | ICD-10-CM

## 2023-12-18 ENCOUNTER — Encounter: Payer: Self-pay | Admitting: Internal Medicine

## 2023-12-18 LAB — LIPID PANEL
Cholesterol: 161 mg/dL (ref <200)
HDL: 48 mg/dL — ABNORMAL LOW (ref >=50)
LDL Cholesterol (Calc): 98 mg/dL
Non-HDL Cholesterol (Calc): 113 mg/dL (ref <130)
Total CHOL/HDL Ratio: 3.4 (calc) (ref <5.0)
Triglycerides: 61 mg/dL (ref <150)

## 2023-12-18 LAB — COMPLETE METABOLIC PANEL WITH GFR
AG Ratio: 1.5 (calc) (ref 1.0–2.5)
ALT: 13 U/L (ref 6–29)
AST: 15 U/L (ref 10–35)
Albumin: 3.8 g/dL (ref 3.6–5.1)
Alkaline phosphatase (APISO): 68 U/L (ref 37–153)
BUN/Creatinine Ratio: 13 (calc) (ref 6–22)
BUN: 14 mg/dL (ref 7–25)
CO2: 26 mmol/L (ref 20–32)
Calcium: 9.1 mg/dL (ref 8.6–10.4)
Chloride: 107 mmol/L (ref 98–110)
Creat: 1.04 mg/dL — ABNORMAL HIGH (ref 0.50–1.03)
Globulin: 2.6 g/dL (ref 1.9–3.7)
Glucose, Bld: 84 mg/dL (ref 65–99)
Potassium: 4.3 mmol/L (ref 3.5–5.3)
Sodium: 139 mmol/L (ref 135–146)
Total Bilirubin: 0.4 mg/dL (ref 0.2–1.2)
Total Protein: 6.4 g/dL (ref 6.1–8.1)
eGFR: 63 mL/min/{1.73_m2} (ref >=60)

## 2024-01-23 ENCOUNTER — Other Ambulatory Visit: Payer: Self-pay

## 2024-01-23 MED FILL — Zolpidem Tartrate Tab 10 MG: ORAL | 25 days supply | Qty: 15 | Fill #1 | Status: CN

## 2024-01-26 ENCOUNTER — Other Ambulatory Visit: Payer: Self-pay

## 2024-01-26 MED FILL — Zolpidem Tartrate Tab 10 MG: ORAL | 25 days supply | Qty: 15 | Fill #1 | Status: AC

## 2024-02-08 ENCOUNTER — Other Ambulatory Visit: Payer: Self-pay | Admitting: Internal Medicine

## 2024-02-09 NOTE — Telephone Encounter (Signed)
 Requested medication (s) are due for refill today: Yes  Requested medication (s) are on the active medication list: Yes  Last refill:  11/24/23  Future visit scheduled: No  Notes to clinic:  Unable to refill per protocol, cannot delegate.      Requested Prescriptions  Pending Prescriptions Disp Refills   zolpidem (AMBIEN) 10 MG tablet 30 tablet 0    Sig: Take 1 tablet (10 mg total) by mouth at bedtime.     Not Delegated - Psychiatry:  Anxiolytics/Hypnotics Failed - 02/09/2024  4:56 PM      Failed - This refill cannot be delegated      Failed - Urine Drug Screen completed in last 360 days      Passed - Valid encounter within last 6 months    Recent Outpatient Visits           5 months ago Class 1 obesity due to excess calories with serious comorbidity and body mass index (BMI) of 33.0 to 33.9 in adult   [facility] [place], Salvadore [place], NP   10 months ago Encounter for general adult medical examination with abnormal findings   [facility] [place], [place], NP   10 months ago Seasonal allergic rhinitis due to pollen   [facility] [place], Salvadore [place], NP   1 year ago Acute pain of right shoulder   [facility] [place], Oswaldo [place], [place]   1 year ago Drug-induced constipation   [facility] [place], Salvadore [place], [place]

## 2024-02-10 ENCOUNTER — Other Ambulatory Visit: Payer: Self-pay

## 2024-02-10 MED ORDER — ZOLPIDEM TARTRATE 10 MG PO TABS
10.0000 mg | ORAL_TABLET | Freq: Every day | ORAL | 0 refills | Status: DC
  Filled 2024-02-10 – 2024-02-11 (×3): qty 30, 30d supply, fill #0

## 2024-02-11 ENCOUNTER — Other Ambulatory Visit: Payer: Self-pay | Admitting: Internal Medicine

## 2024-02-11 ENCOUNTER — Other Ambulatory Visit: Payer: Self-pay

## 2024-02-12 NOTE — Telephone Encounter (Signed)
 OV within protocol.  Requested Prescriptions  Pending Prescriptions Disp Refills   atorvastatin (LIPITOR) 20 MG tablet [Pharmacy Med Name: ATORVASTATIN 20 MG TABLET] 90 tablet 0    Sig: TAKE 1 TABLET BY MOUTH EVERY DAY     Cardiovascular:  Antilipid - Statins Failed - 02/12/2024 12:42 PM      Failed - Valid encounter within last 12 months    Recent Outpatient Visits   None            Failed - Lipid Panel in normal range within the last 12 months    Cholesterol  Date Value Ref Range Status  12/17/2023 161 <200 mg/dL Final   LDL Cholesterol (Calc)  Date Value Ref Range Status  12/17/2023 98 mg/dL (calc) Final    Comment:    Reference range: <100 . Desirable range <100 mg/dL for primary prevention;   <70 mg/dL for patients with CHD or diabetic patients  with > or = 2 CHD risk factors. Marland Kitchen LDL-C is now calculated using the Martin-Hopkins  calculation, which is a validated novel method providing  better accuracy than the Friedewald equation in the  estimation of LDL-C.  Horald Pollen et al. Lenox Ahr. 1610;960(45): 2061-2068  (http://education.[employer].com/faq/FAQ164)    HDL  Date Value Ref Range Status  12/17/2023 48 (L) > OR = 50 mg/dL Final   Triglycerides  Date Value Ref Range Status  12/17/2023 61 <150 mg/dL Final         Passed - Patient is not pregnant

## 2024-03-10 ENCOUNTER — Other Ambulatory Visit: Payer: Self-pay | Admitting: Internal Medicine

## 2024-03-11 ENCOUNTER — Encounter: Payer: Self-pay | Admitting: Internal Medicine

## 2024-03-11 NOTE — Telephone Encounter (Signed)
 Requested medication (s) are due for refill today: Yes  Requested medication (s) are on the active medication list: Yes  Last refill:  02/10/24  Future visit scheduled: No  Notes to clinic:  Unable to refill per protocol, cannot delegate.      Requested Prescriptions  Pending Prescriptions Disp Refills   zolpidem  (AMBIEN ) 10 MG tablet 30 tablet 0    Sig: Take 1 tablet (10 mg total) by mouth at bedtime.     Not Delegated - Psychiatry:  Anxiolytics/Hypnotics Failed - 03/11/2024  4:52 PM      Failed - This refill cannot be delegated      Failed - Urine Drug Screen completed in last 360 days      Failed - Valid encounter within last 6 months    Recent Outpatient Visits   None

## 2024-03-12 ENCOUNTER — Other Ambulatory Visit: Payer: Self-pay

## 2024-03-12 MED ORDER — ZOLPIDEM TARTRATE 10 MG PO TABS
10.0000 mg | ORAL_TABLET | Freq: Every day | ORAL | 0 refills | Status: DC
  Filled 2024-03-12: qty 15, 25d supply, fill #0
  Filled 2024-04-13: qty 15, 25d supply, fill #1

## 2024-04-14 ENCOUNTER — Other Ambulatory Visit: Payer: Self-pay

## 2024-04-26 ENCOUNTER — Other Ambulatory Visit: Payer: Self-pay | Admitting: Internal Medicine

## 2024-04-27 NOTE — Telephone Encounter (Signed)
 Requested medication (s) are due for refill today: na  Requested medication (s) are on the active medication list: yes  Last refill:  03/12/24 #30 0 refills  Future visit scheduled: no   Notes to clinic:   not delegated per protocol.  Patient comment: I will be leaving sunday to go vacation for a week        Last OV 09/15/23. Do you want to refill Rx?     Requested Prescriptions  Pending Prescriptions Disp Refills   zolpidem  (AMBIEN ) 10 MG tablet 30 tablet 0    Sig: Take 1 tablet (10 mg total) by mouth at bedtime.     Not Delegated - Psychiatry:  Anxiolytics/Hypnotics Failed - 04/27/2024  3:24 PM      Failed - This refill cannot be delegated      Failed - Urine Drug Screen completed in last 360 days      Failed - Valid encounter within last 6 months    Recent Outpatient Visits   None

## 2024-04-28 ENCOUNTER — Other Ambulatory Visit: Payer: Self-pay

## 2024-04-29 ENCOUNTER — Telehealth: Payer: Self-pay

## 2024-04-29 ENCOUNTER — Other Ambulatory Visit: Payer: Self-pay

## 2024-04-29 ENCOUNTER — Other Ambulatory Visit: Payer: Self-pay | Admitting: Internal Medicine

## 2024-04-29 MED ORDER — ZOLPIDEM TARTRATE 10 MG PO TABS: 10.0000 mg | ORAL_TABLET | Freq: Every day | ORAL | 0 refills | Status: DC

## 2024-04-29 NOTE — Addendum Note (Signed)
 Addended by: Carollynn Cirri on: 04/29/2024 01:34 PM   Modules accepted: Orders

## 2024-04-29 NOTE — Telephone Encounter (Signed)
 Copied from CRM [id number]. Topic: General - Other >> Apr 29, 2024 12:58 PM Emylou G wrote: Reason for CRM: Pls call patient - zolpidem  (AMBIEN ) 10 MG table is getting denied?  Does she need an appt?

## 2024-04-30 ENCOUNTER — Other Ambulatory Visit: Payer: Self-pay

## 2024-05-10 ENCOUNTER — Ambulatory Visit (INDEPENDENT_AMBULATORY_CARE_PROVIDER_SITE_OTHER): Admitting: Internal Medicine

## 2024-05-10 ENCOUNTER — Encounter: Payer: Self-pay | Admitting: Internal Medicine

## 2024-05-10 VITALS — BP 110/74 | Ht 63.0 in | Wt 176.4 lb

## 2024-05-10 DIAGNOSIS — E7800 Pure hypercholesterolemia, unspecified: Secondary | ICD-10-CM | POA: Diagnosis not present

## 2024-05-10 DIAGNOSIS — R7303 Prediabetes: Secondary | ICD-10-CM | POA: Diagnosis not present

## 2024-05-10 DIAGNOSIS — E66811 Obesity, class 1: Secondary | ICD-10-CM | POA: Diagnosis not present

## 2024-05-10 DIAGNOSIS — Z0001 Encounter for general adult medical examination with abnormal findings: Secondary | ICD-10-CM

## 2024-05-10 DIAGNOSIS — Z6831 Body mass index (BMI) 31.0-31.9, adult: Secondary | ICD-10-CM

## 2024-05-10 DIAGNOSIS — E6609 Other obesity due to excess calories: Secondary | ICD-10-CM

## 2024-05-10 NOTE — Patient Instructions (Signed)
 Health Maintenance for Postmenopausal Women Menopause is a normal process in which your ability to get pregnant comes to an end. This process happens slowly over many months or years, usually between the ages of 24 and 62. Menopause is complete when you have missed your menstrual period for 12 months. It is important to talk with your health care provider about some of the most common conditions that affect women after menopause (postmenopausal women). These include heart disease, cancer, and bone loss (osteoporosis). Adopting a healthy lifestyle and getting preventive care can help to promote your health and wellness. The actions you take can also lower your chances of developing some of these common conditions. What are the signs and symptoms of menopause? During menopause, you may have the following symptoms: Hot flashes. These can be moderate or severe. Night sweats. Decrease in sex drive. Mood swings. Headaches. Tiredness (fatigue). Irritability. Memory problems. Problems falling asleep or staying asleep. Talk with your health care provider about treatment options for your symptoms. Do I need hormone replacement therapy? Hormone replacement therapy is effective in treating symptoms that are caused by menopause, such as hot flashes and night sweats. Hormone replacement carries certain risks, especially as you become older. If you are thinking about using estrogen or estrogen with progestin, discuss the benefits and risks with your health care provider. How can I reduce my risk for heart disease and stroke? The risk of heart disease, heart attack, and stroke increases as you age. One of the causes may be a change in the body's hormones during menopause. This can affect how your body uses dietary fats, triglycerides, and cholesterol. Heart attack and stroke are medical emergencies. There are many things that you can do to help prevent heart disease and stroke. Watch your blood pressure High  blood pressure causes heart disease and increases the risk of stroke. This is more likely to develop in people who have high blood pressure readings or are overweight. Have your blood pressure checked: Every 3-5 years if you are 50-75 years of age. Every year if you are 77 years old or older. Eat a healthy diet  Eat a diet that includes plenty of vegetables, fruits, low-fat dairy products, and lean protein. Do not eat a lot of foods that are high in solid fats, added sugars, or sodium. Get regular exercise Get regular exercise. This is one of the most important things you can do for your health. Most adults should: Try to exercise for at least 150 minutes each week. The exercise should increase your heart rate and make you sweat (moderate-intensity exercise). Try to do strengthening exercises at least twice each week. Do these in addition to the moderate-intensity exercise. Spend less time sitting. Even light physical activity can be beneficial. Other tips Work with your health care provider to achieve or maintain a healthy weight. Do not use any products that contain nicotine or tobacco. These products include cigarettes, chewing tobacco, and vaping devices, such as e-cigarettes. If you need help quitting, ask your health care provider. Know your numbers. Ask your health care provider to check your cholesterol and your blood sugar (glucose). Continue to have your blood tested as directed by your health care provider. Do I need screening for cancer? Depending on your health history and family history, you may need to have cancer screenings at different stages of your life. This may include screening for: Breast cancer. Cervical cancer. Lung cancer. Colorectal cancer. What is my risk for osteoporosis? After menopause, you may be  at increased risk for osteoporosis. Osteoporosis is a condition in which bone destruction happens more quickly than new bone creation. To help prevent osteoporosis or  the bone fractures that can happen because of osteoporosis, you may take the following actions: If you are 61-3 years old, get at least 1,000 mg of calcium and at least 600 international units (IU) of vitamin D per day. If you are older than age 61 but younger than age 75, get at least 1,200 mg of calcium and at least 600 international units (IU) of vitamin D per day. If you are older than age 62, get at least 1,200 mg of calcium and at least 800 international units (IU) of vitamin D per day. Smoking and drinking excessive alcohol increase the risk of osteoporosis. Eat foods that are rich in calcium and vitamin D, and do weight-bearing exercises several times each week as directed by your health care provider. How does menopause affect my mental health? Depression may occur at any age, but it is more common as you become older. Common symptoms of depression include: Feeling depressed. Changes in sleep patterns. Changes in appetite or eating patterns. Feeling an overall lack of motivation or enjoyment of activities that you previously enjoyed. Frequent crying spells. Talk with your health care provider if you think that you are experiencing any of these symptoms. General instructions See your health care provider for regular wellness exams and vaccines. This may include: Scheduling regular health, dental, and eye exams. Getting and maintaining your vaccines. These include: Influenza vaccine. Get this vaccine each year before the flu season begins. Pneumonia vaccine. Shingles vaccine. Tetanus, diphtheria, and pertussis (Tdap) booster vaccine. Your health care provider may also recommend other immunizations. Tell your health care provider if you have ever been abused or do not feel safe at home. Summary Menopause is a normal process in which your ability to get pregnant comes to an end. This condition causes hot flashes, night sweats, decreased interest in sex, mood swings, headaches, or lack  of sleep. Treatment for this condition may include hormone replacement therapy. Take actions to keep yourself healthy, including exercising regularly, eating a healthy diet, watching your weight, and checking your blood pressure and blood sugar levels. Get screened for cancer and depression. Make sure that you are up to date with all your vaccines. This information is not intended to replace advice given to you by your health care provider. Make sure you discuss any questions you have with your health care provider. Document Revised: 03/26/2021 Document Reviewed: 03/26/2021 Elsevier Patient Education  2024 [employer].

## 2024-05-10 NOTE — Assessment & Plan Note (Signed)
 Encouraged diet and exercise for weight loss ?

## 2024-05-10 NOTE — Progress Notes (Signed)
 Subjective:    Patient ID: Amy Grimes, female    DOB: [birth year], 58 y.o.   MRN: [id number]  HPI  Patient presents to clinic today for her annual exam.  Flu: 08/2023 Tetanus: 11/2016 COVID: x 3 Shingrix: Never Pap smear: 10/2022, [facility] Mammogram: 09/2023 Bone density: Never Colon screening: 01/2017 Vision screening: annually Dentist: biannually  Diet: She does eat lean meat. She consumes fruits and veggies. She tries to avoid fried foods. She drinks mostly water. Exercise: Walking  Review of Systems     Past Medical History:  Diagnosis Date   Allergy    Arthritis    knees   Hemorrhoids    Hyperlipidemia     Current Outpatient Medications  Medication Sig Dispense Refill   atorvastatin  (LIPITOR) 20 MG tablet TAKE 1 TABLET BY MOUTH EVERY DAY 90 tablet 0   ibuprofen (ADVIL) 800 MG tablet Take 800 mg by mouth 3 (three) times daily.     methocarbamol  (ROBAXIN ) 500 MG tablet Take 1 tablet (500 mg total) by mouth every 8 (eight) hours as needed for muscle spasms. 20 tablet 0   Semaglutide -Weight Management (WEGOVY ) 2.4 MG/0.75ML SOAJ Inject 2.4 mg into the skin once a week.     zolpidem  (AMBIEN ) 10 MG tablet Take 1 tablet (10 mg total) by mouth at bedtime. 30 tablet 0   No current facility-administered medications for this visit.    Allergies  Allergen Reactions   Biaxin [Clarithromycin] Other (See Comments)    Severe abdominal cramping   Penicillins Hives    Family History  Problem Relation Age of Onset   Arthritis Mother    Hyperlipidemia Mother    Hypertension Mother    Heart disease Father    Breast cancer Paternal Aunt    Colon cancer Neg Hx    Colon polyps Neg Hx    Esophageal cancer Neg Hx    Rectal cancer Neg Hx    Stomach cancer Neg Hx     Social History   Socioeconomic History   Marital status: Married    Spouse name: Not on file   Number of children: Not on file   Years of education: Not on file   Highest education level: Not  on file  Occupational History   Occupation: Psychologist, sport and exercise: [employer]  Tobacco Use   Smoking status: Never   Smokeless tobacco: Never  Substance and Sexual Activity   Alcohol use: Yes    Alcohol/week: 0.0 standard drinks of alcohol    Comment: occasional   Drug use: No   Sexual activity: Yes    Birth control/protection: Surgical  Other Topics Concern   Not on file  Social History Narrative   Not on file   Social Drivers of Health   Financial Resource Strain: Not on file  Food Insecurity: Not on file  Transportation Needs: Not on file  Physical Activity: Not on file  Stress: Not on file  Social Connections: Not on file  Intimate Partner Violence: Not on file     Constitutional: Denies fever, malaise, fatigue, headache or abrupt weight changes.  HEENT: Denies eye pain, eye redness, ear pain, ringing in the ears, wax buildup, runny nose, nasal congestion, bloody nose, or sore throat. Respiratory: Patient reports cough.  Denies difficulty breathing, shortness of breath, or sputum production.   Cardiovascular: Denies chest pain, chest tightness, palpitations or swelling in the hands or feet.  Gastrointestinal: Denies abdominal pain, bloating, constipation, diarrhea or blood in the  stool.  GU: Denies urgency, frequency, pain with urination, burning sensation, blood in urine, odor or discharge. Musculoskeletal: Pt reports right knee pain. Denies decrease in range of motion, difficulty with gait, muscle pain or joint swelling.  Skin: Denies redness, rashes, or ulcercations.  Neurological: Patient reports insomnia.  Denies dizziness, difficulty with memory, difficulty with speech or problems with balance and coordination.  Psych: Denies anxiety, depression, SI/HI.  No other specific complaints in a complete review of systems (except as listed in HPI above).  Objective:   Physical Exam BP 110/74 (BP Location: Right Arm, Patient Position: Sitting, Cuff Size: Normal)   Ht  5' 3 (1.6 m)   Wt 176 lb 6.4 oz (80 kg)   LMP 10/30/2018   BMI 31.25 kg/m    Wt Readings from Last 3 Encounters:  08/26/23 188 lb (85.3 kg)  03/19/23 190 lb 12.8 oz (86.5 kg)  03/17/23 192 lb (87.1 kg)    General: Appears her stated age, obese, in NAD. Skin: Warm, dry and intact.  Scattered, hypopigmented lesions noted of BLE likely represent scarring. HEENT: Head: normal shape and size; Eyes: sclera white, no icterus, conjunctiva pink, PERRLA and EOMs intact;  Neck:  Neck supple, trachea midline. No masses, lumps or thyromegaly present.  Cardiovascular: Normal rate and rhythm. S1,S2 noted.  No murmur, rubs or gallops noted. No JVD or BLE edema. No carotid bruits noted. Pulmonary/Chest: Normal effort and positive vesicular breath sounds. No respiratory distress. No wheezes, rales or ronchi noted.  Abdomen: Normal bowel sounds.  Musculoskeletal: Strength 5/5 BUE/BLE.  No difficulty with gait.  Neurological: Alert and oriented. Cranial nerves II-XII grossly intact. Coordination normal.  Psychiatric: Mood and affect normal. Behavior is normal. Judgment and thought content normal.     BMET    Component Value Date/Time   NA 139 12/17/2023 0855   K 4.3 12/17/2023 0855   CL 107 12/17/2023 0855   CO2 26 12/17/2023 0855   GLUCOSE 84 12/17/2023 0855   BUN 14 12/17/2023 0855   CREATININE 1.04 (H) 12/17/2023 0855   CALCIUM  9.1 12/17/2023 0855    Lipid Panel     Component Value Date/Time   CHOL 161 12/17/2023 0855   TRIG 61 12/17/2023 0855   HDL 48 (L) 12/17/2023 0855   CHOLHDL 3.4 12/17/2023 0855   VLDL 14.6 12/16/2016 1522   LDLCALC 98 12/17/2023 0855    CBC    Component Value Date/Time   WBC 5.7 03/19/2023 1508   RBC 4.69 03/19/2023 1508   HGB 12.3 03/19/2023 1508   HCT 38.4 03/19/2023 1508   PLT 389 03/19/2023 1508   MCV 81.9 03/19/2023 1508   MCH 26.2 (L) 03/19/2023 1508   MCHC 32.0 03/19/2023 1508   RDW 13.8 03/19/2023 1508    Hgb A1C Lab Results  Component  Value Date   HGBA1C 5.9 (H) 03/19/2023           Assessment & Plan:   Preventative Health Maintenance:  Encouraged her to get a flu shot in the fall Tetanus UTD Encouraged her to get her COVID booster Discussed Shingrix vaccine, she will check coverage with her insurance company and schedule visit if she would like to have this done Pap smear UTD Mammogram UTD Colon screening UTD Encouraged her to consume a balanced diet and exercise regimen Advised her to see an eye doctor and dentist annually Will check CBC, CMET, lipid, A1c today   RTC in 6 months, follow-up chronic conditions Angeline Laura, NP

## 2024-05-11 ENCOUNTER — Ambulatory Visit: Payer: Self-pay | Admitting: Internal Medicine

## 2024-05-11 LAB — COMPREHENSIVE METABOLIC PANEL WITH GFR
AG Ratio: 1.4 (calc) (ref 1.0–2.5)
ALT: 21 U/L (ref 6–29)
AST: 19 U/L (ref 10–35)
Albumin: 3.9 g/dL (ref 3.6–5.1)
Alkaline phosphatase (APISO): 68 U/L (ref 37–153)
BUN/Creatinine Ratio: 11 (calc) (ref 6–22)
BUN: 14 mg/dL (ref 7–25)
CO2: 25 mmol/L (ref 20–32)
Calcium: 9.2 mg/dL (ref 8.6–10.4)
Chloride: 104 mmol/L (ref 98–110)
Creat: 1.3 mg/dL — ABNORMAL HIGH (ref 0.50–1.03)
Globulin: 2.8 g/dL (ref 1.9–3.7)
Glucose, Bld: 82 mg/dL (ref 65–139)
Potassium: 4 mmol/L (ref 3.5–5.3)
Sodium: 136 mmol/L (ref 135–146)
Total Bilirubin: 0.4 mg/dL (ref 0.2–1.2)
Total Protein: 6.7 g/dL (ref 6.1–8.1)
eGFR: 48 mL/min/{1.73_m2} — ABNORMAL LOW (ref >=60)

## 2024-05-11 LAB — CBC
HCT: 38.1 % (ref 35.0–45.0)
Hemoglobin: 11.8 g/dL (ref 11.7–15.5)
MCH: 25.9 pg — ABNORMAL LOW (ref 27.0–33.0)
MCHC: 31 g/dL — ABNORMAL LOW (ref 32.0–36.0)
MCV: 83.7 fL (ref 80.0–100.0)
MPV: 9.7 fL (ref 7.5–12.5)
Platelets: 402 10*3/uL — ABNORMAL HIGH (ref 140–400)
RBC: 4.55 10*6/uL (ref 3.80–5.10)
RDW: 13.6 % (ref 11.0–15.0)
WBC: 5.4 10*3/uL (ref 3.8–10.8)

## 2024-05-11 LAB — HEMOGLOBIN A1C
Hgb A1c MFr Bld: 6 % — ABNORMAL HIGH (ref <5.7)
Mean Plasma Glucose: 126 mg/dL
eAG (mmol/L): 7 mmol/L

## 2024-05-11 LAB — LIPID PANEL
Cholesterol: 160 mg/dL (ref <200)
HDL: 56 mg/dL (ref >=50)
LDL Cholesterol (Calc): 90 mg/dL
Non-HDL Cholesterol (Calc): 104 mg/dL (ref <130)
Total CHOL/HDL Ratio: 2.9 (calc) (ref <5.0)
Triglycerides: 54 mg/dL (ref <150)

## 2024-05-14 ENCOUNTER — Other Ambulatory Visit: Payer: Self-pay | Admitting: Internal Medicine

## 2024-05-14 ENCOUNTER — Ambulatory Visit: Payer: Self-pay

## 2024-05-14 NOTE — Telephone Encounter (Signed)
 Agree with advice given as we have no available appointments this afternoon

## 2024-05-14 NOTE — Telephone Encounter (Signed)
 Requested Prescriptions  Pending Prescriptions Disp Refills   atorvastatin  (LIPITOR) 20 MG tablet [Pharmacy Med Name: ATORVASTATIN  20 MG TABLET] 90 tablet 3    Sig: TAKE 1 TABLET BY MOUTH EVERY DAY     Cardiovascular:  Antilipid - Statins Failed - 05/14/2024  3:43 PM      Failed - Valid encounter within last 12 months    Recent Outpatient Visits           4 days ago Encounter for general adult medical examination with abnormal findings   [facility] [place], [place], NP              Failed - Lipid Panel in normal range within the last 12 months    Cholesterol  Date Value Ref Range Status  05/10/2024 160 <200 mg/dL Final   LDL Cholesterol (Calc)  Date Value Ref Range Status  05/10/2024 90 mg/dL (calc) Final    Comment:    Reference range: <100 . Desirable range <100 mg/dL for primary prevention;   <70 mg/dL for patients with CHD or diabetic patients  with > or = 2 CHD risk factors. SABRA LDL-C is now calculated using the Martin-Hopkins  calculation, which is a validated novel method providing  better accuracy than the Friedewald equation in the  estimation of LDL-C.  Gladis APPLETHWAITE et al. SANDREA. 7986;689(80): 2061-2068  (http://education.[employer].com/faq/FAQ164)    HDL  Date Value Ref Range Status  05/10/2024 56 > OR = 50 mg/dL Final   Triglycerides  Date Value Ref Range Status  05/10/2024 54 <150 mg/dL Final         Passed - Patient is not pregnant

## 2024-05-14 NOTE — Telephone Encounter (Signed)
 FYI Only or Action Required?: FYI only for provider.  Patient was last seen in primary care on 05/10/2024 by Antonette Angeline ORN, NP. Called Nurse Triage reporting Wheezing. Symptoms began today. Interventions attempted: Nothing. Symptoms are: gradually worsening.  Triage Disposition: No disposition on file. No appt avail today. Pt instructed to go to UC within 24 hours.   Patient/caregiver understands and will follow disposition?:    FYI: Cough x 1 week, getting better but now noticing occasional wheezing when inhaling. Denies shortness of breath or chest pain, but says the wheezing is noticeable at night    Copied from CRM [id number]. Topic: Clinical - Red Word Triage >> May 14, 2024  2:06 PM Amy Grimes wrote: Red Word that prompted transfer to Nurse Triage: Patient is calling to report she she saw [facility] Monday with a cough. And started Wheezing yesterday that comes and goes with headaches. That stopped today. Additional Information  Negative: [1] MILD difficulty breathing (e.g., minimal/no SOB at rest, SOB with walking, pulse <100) AND [2] NEW-onset or WORSE than normal    Cough x 1 week, getting better but now noticing occasional wheezing when inhaling. Denies shortness of breath or chest pain, but says the wheezing is noticeable.  Answer Assessment - Initial Assessment Questions 1. RESPIRATORY STATUS: Describe your breathing? (e.g., wheezing, shortness of breath, unable to speak, severe coughing)      Wheezing; can feel it more when inhaling. No shortness of breath  2. ONSET: When did this breathing problem begin?      Started yesterday  3. PATTERN Does the difficult breathing come and go, or has it been constant since it started?      Wheezing comes and goes  4. SEVERITY: How bad is your breathing? (e.g., mild, moderate, severe)    - MILD: No SOB at rest, mild SOB with walking, speaks normally in sentences, can lie down, no retractions, pulse < 100.    - MODERATE: SOB at rest,  SOB with minimal exertion and prefers to sit, cannot lie down flat, speaks in phrases, mild retractions, audible wheezing, pulse 100-120.    - SEVERE: Very SOB at rest, speaks in single words, struggling to breathe, sitting hunched forward, retractions, pulse > 120      No shortness of breath  5. RECURRENT SYMPTOM: Have you had difficulty breathing before? If Yes, ask: When was the last time? and What happened that time?      Yes  6. CARDIAC HISTORY: Do you have any history of heart disease? (e.g., heart attack, angina, bypass surgery, angioplasty)      No  7. LUNG HISTORY: Do you have any history of lung disease?  (e.g., pulmonary embolus, asthma, emphysema)     No  8. CAUSE: What do you think is causing the breathing problem?      Getting over a cough that started Last week Saturday  9. OTHER SYMPTOMS: Do you have any other symptoms? (e.g., dizziness, runny nose, cough, chest pain, fever)     Cough, intermittent. Occasional sneezing  10. O2 SATURATION MONITOR:  Do you use an oxygen saturation monitor (pulse oximeter) at home? If Yes, ask: What is your reading (oxygen level) today? What is your usual oxygen saturation reading? (e.g., 95%)       No  11. PREGNANCY: Is there any chance you are pregnant? When was your last menstrual period?       No  12. TRAVEL: Have you traveled out of the country in the last month? (  e.g., travel history, exposures)       Was on a cruise this past Saturday  Protocols used: Breathing Difficulty-A-AH

## 2024-05-30 ENCOUNTER — Other Ambulatory Visit: Payer: Self-pay | Admitting: Internal Medicine

## 2024-06-01 NOTE — Telephone Encounter (Signed)
 Requested medications are due for refill today.  yes  Requested medications are on the active medications list.  yes  Last refill. 04/29/2024 #30 0 rf  Future visit scheduled.   yes  Notes to clinic.  Refill not delegated.    Requested Prescriptions  Pending Prescriptions Disp Refills   zolpidem  (AMBIEN ) 10 MG tablet [Pharmacy Med Name: ZOLPIDEM  TARTRATE 10 MG TABLET] 30 tablet 0    Sig: TAKE 1 TABLET BY MOUTH EVERYDAY AT BEDTIME     Not Delegated - Psychiatry:  Anxiolytics/Hypnotics Failed - 06/01/2024  2:18 PM      Failed - This refill cannot be delegated      Failed - Urine Drug Screen completed in last 360 days      Passed - Valid encounter within last 6 months    Recent Outpatient Visits           3 weeks ago Encounter for general adult medical examination with abnormal findings   [facility] [place], Angeline ORN, NP

## 2024-06-11 ENCOUNTER — Encounter: Payer: Self-pay | Admitting: Internal Medicine

## 2024-06-28 ENCOUNTER — Other Ambulatory Visit: Payer: Self-pay | Admitting: Internal Medicine

## 2024-07-01 ENCOUNTER — Telehealth: Payer: Self-pay

## 2024-07-01 MED ORDER — ZOLPIDEM TARTRATE 10 MG PO TABS: 10.0000 mg | ORAL_TABLET | Freq: Every evening | ORAL | 0 refills | Status: DC | PRN

## 2024-07-01 NOTE — Addendum Note (Signed)
 Addended by: ANTONETTE ANGELINE ORN on: 07/01/2024 01:38 PM   Modules accepted: Orders

## 2024-07-01 NOTE — Telephone Encounter (Signed)
 Copied from CRM [id number]. Topic: General - Other >> Jul 01, 2024  9:54 AM Amy Grimes wrote: Reason for CRM: patietn calling asking for update on refill for zolpidem (AMBIEN) 10 MG tablet  patient took her last pill last night 06/30/24  reifll is pending. Pharmacy requested refill on 06/28/24

## 2024-07-13 ENCOUNTER — Ambulatory Visit: Admitting: Internal Medicine

## 2024-07-13 ENCOUNTER — Encounter: Payer: Self-pay | Admitting: Internal Medicine

## 2024-07-13 VITALS — BP 108/64 | Ht 63.0 in | Wt 171.2 lb

## 2024-07-13 DIAGNOSIS — K047 Periapical abscess without sinus: Secondary | ICD-10-CM | POA: Diagnosis not present

## 2024-07-13 DIAGNOSIS — N1831 Chronic kidney disease, stage 3a: Secondary | ICD-10-CM | POA: Diagnosis not present

## 2024-07-13 DIAGNOSIS — R590 Localized enlarged lymph nodes: Secondary | ICD-10-CM

## 2024-07-13 MED ORDER — METHYLPREDNISOLONE ACETATE 80 MG/ML IJ SUSP
80.0000 mg | Freq: Once | INTRAMUSCULAR | Status: AC
  Administered 2024-07-13: 80 mg via INTRAMUSCULAR

## 2024-07-13 NOTE — Patient Instructions (Signed)
 Lymphadenopathy  Lymphadenopathy is when your lymph glands are swollen or larger than normal.  Lymph glands, also called lymph nodes, are clumps of tissue. They filter germs and waste from tissues in your body to your bloodstream. They're part of your body's defense system, or immune system. Lymphadenopathy has different causes, like infection, autoimmune disease, and cancer. Lymphadenopathy can happen wherever you have lymph nodes. The type you have depends on which nodes it's in, such as: Cervical lymphadenopathy. This is in the neck. Mediastinal lymphadenopathy. This is in the chest. Hilar lymphadenopathy. This is in the lungs. Axillary lymphadenopathy. This is in the armpits. Inguinal lymphadenopathy. This is in the groin. Sometimes, fluid and cells that fight infection build up in your lymph nodes. This happens when your immune system reacts to germs or other substances that get into your body. This makes lymph nodes swell and get bigger. Treatment is based on what's thought to be the cause. Sometimes, lymph nodes don't go back to normal size after treatment. If yours don't, your health care provider may order tests to help learn why your glands are still swollen and big. Follow these instructions at home:  Take over-the-counter and prescription medicines only as told by your provider. If you were prescribed antibiotics, do not stop using them, even if you start to feel better. If told, apply heat to swollen lymph nodes as told by your provider. Use the heat source that your provider recommends, such as a moist heat pack or a heating pad. Place a towel between your skin and the heat source. Leave the heat on only for the time told by your provider to avoid injury. If your skin turns bright red, remove the heat right away to prevent burns. The risk of burns is higher if you cannot feel pain, heat, or cold. Check your swollen lymph nodes every day for changes. Check other places where you have  lymph nodes as told. Check for changes such as: More swelling. Sudden growth in size. Redness or pain. Hardness. Contact a health care provider if: You have lymph nodes that: Are still swollen after 2 weeks. Have gotten bigger all of a sudden or the swelling spreads. Are red, painful, or hard. Fluid leaks from the skin near a swollen lymph node. You get a fever, chills, or night sweats. You feel tired. You have a sore throat. Your abdomen hurts. You lose weight without trying. This information is not intended to replace advice given to you by your health care provider. Make sure you discuss any questions you have with your health care provider. Document Revised: 01/29/2023 Document Reviewed: 01/29/2023 Elsevier Patient Education  2024 [employer].

## 2024-07-13 NOTE — Progress Notes (Signed)
 Subjective:    Patient ID: Amy Grimes, female    DOB: 13-Aug-1966, 58 y.o.   MRN: [id number]  HPI  Discussed the use of AI scribe software for clinical note transcription with the patient, who gave verbal consent to proceed.  Amy Grimes is a 58 year old female who presents with a neck mass.  She noticed a mass in her neck last week, located on the right side. There has been no increase in size, tenderness, redness, warmth, or drainage. She has not experienced any recent illness but mentions a runny nose due to allergies. No specific medication has been taken for the mass.  She has a history of knee issues, with a previous injection that is wearing off, and is scheduled for knee surgery on October 22nd. She is cautious about using ibuprofen due to concerns about kidney function, as her creatinine was previously high and her GFR was slightly low, 1.30/48, 04/2024. She took ibuprofen a couple of days ago but is trying to avoid it.  She denies feeling overly tired or having abdominal pain. She reports a runny nose due to allergies but denies ear pain, sore throat, or cough.  She does report having an abscessed tooth last week.  She went to the dentist who prescribed azithromycin .  These nodules on the right side of her neck popped up shortly after she started the antibiotic.  She has already completed the antibiotic.      Review of Systems     Past Medical History:  Diagnosis Date   Allergy    Arthritis    knees   Hemorrhoids    Hyperlipidemia     Current Outpatient Medications  Medication Sig Dispense Refill   atorvastatin  (LIPITOR) 20 MG tablet TAKE 1 TABLET BY MOUTH EVERY DAY 90 tablet 3   ibuprofen (ADVIL) 800 MG tablet Take 800 mg by mouth 3 (three) times daily.     Levocetirizine Dihydrochloride (XYZAL PO) Take by mouth daily.     Semaglutide -Weight Management (WEGOVY ) 2.4 MG/0.75ML SOAJ Inject 2.4 mg into the skin once a week.     zolpidem  (AMBIEN ) 10 MG tablet Take 1  tablet (10 mg total) by mouth at bedtime as needed for sleep. 30 tablet 0   No current facility-administered medications for this visit.    Allergies  Allergen Reactions   Biaxin [Clarithromycin] Other (See Comments)    Severe abdominal cramping   Penicillins Hives    Family History  Problem Relation Age of Onset   Arthritis Mother    Hyperlipidemia Mother    Hypertension Mother    Heart disease Father    Breast cancer Paternal Aunt    Colon cancer Neg Hx    Colon polyps Neg Hx    Esophageal cancer Neg Hx    Rectal cancer Neg Hx    Stomach cancer Neg Hx     Social History   Socioeconomic History   Marital status: Married    Spouse name: Not on file   Number of children: Not on file   Years of education: Not on file   Highest education level: Not on file  Occupational History   Occupation: Psychologist, sport and exercise: [employer]  Tobacco Use   Smoking status: Never   Smokeless tobacco: Never  Substance and Sexual Activity   Alcohol use: Yes    Alcohol/week: 0.0 standard drinks of alcohol    Comment: occasional   Drug use: No   Sexual activity: Yes  Birth control/protection: Surgical  Other Topics Concern   Not on file  Social History Narrative   Not on file   Social Drivers of Health   Financial Resource Strain: Not on file  Food Insecurity: Not on file  Transportation Needs: Not on file  Physical Activity: Not on file  Stress: Not on file  Social Connections: Not on file  Intimate Partner Violence: Not on file     Constitutional: Denies fever, malaise, fatigue, headache or abrupt weight changes.  HEENT: Pt reports runny nose. Denies eye pain, eye redness, ear pain, ringing in the ears, wax buildup, runny nose, nasal congestion, bloody nose, or sore throat. Respiratory: Denies difficulty breathing, shortness of breath, or sputum production.   Cardiovascular: Denies chest pain, chest tightness, palpitations or swelling in the hands or feet.   Gastrointestinal: Denies abdominal pain, bloating, constipation, diarrhea or blood in the stool.  GU: Denies urgency, frequency, pain with urination, burning sensation, blood in urine, odor or discharge. Musculoskeletal: Patient reports right knee pain.  Denies decrease in range of motion, difficulty with gait, muscle pain or joint swelling.  Skin: Patient reports mass to the right side of the neck.  Denies redness, rashes, or ulcercations.  Neurological: Patient reports insomnia.  Denies dizziness, difficulty with memory, difficulty with speech or problems with balance and coordination.  Psych: Denies anxiety, depression, SI/HI.  No other specific complaints in a complete review of systems (except as listed in HPI above).  Objective:   Physical Exam BP 108/64 (BP Location: Right Arm, Patient Position: Sitting, Cuff Size: Normal)   Ht 5' 3 (1.6 m)   Wt 171 lb 3.2 oz (77.7 kg)   LMP 10/30/2018   BMI 30.33 kg/m    Wt Readings from Last 3 Encounters:  05/10/24 176 lb 6.4 oz (80 kg)  08/26/23 188 lb (85.3 kg)  03/19/23 190 lb 12.8 oz (86.5 kg)    General: Appears her stated age, obese, in NAD. Skin: Warm, dry and intact.   HEENT: Head: normal shape and size; Eyes: sclera white, no icterus, conjunctiva pink, PERRLA and EOMs intact;  Neck: Anterior posterior cervical adenopathy noted on the right. Cardiovascular: Normal rate and rhythm. S1,S2 noted.  No murmur, rubs or gallops noted.  Pulmonary/Chest: Normal effort and positive vesicular breath sounds. No respiratory distress. No wheezes, rales or ronchi noted.  Musculoskeletal:  No difficulty with gait.  Neurological: Alert and oriented.    BMET    Component Value Date/Time   NA 136 05/10/2024 1318   K 4.0 05/10/2024 1318   CL 104 05/10/2024 1318   CO2 25 05/10/2024 1318   GLUCOSE 82 05/10/2024 1318   BUN 14 05/10/2024 1318   CREATININE 1.30 (H) 05/10/2024 1318   CALCIUM  9.2 05/10/2024 1318    Lipid Panel      Component Value Date/Time   CHOL 160 05/10/2024 1318   TRIG 54 05/10/2024 1318   HDL 56 05/10/2024 1318   CHOLHDL 2.9 05/10/2024 1318   VLDL 14.6 12/16/2016 1522   LDLCALC 90 05/10/2024 1318    CBC    Component Value Date/Time   WBC 5.4 05/10/2024 1318   RBC 4.55 05/10/2024 1318   HGB 11.8 05/10/2024 1318   HCT 38.1 05/10/2024 1318   PLT 402 (H) 05/10/2024 1318   MCV 83.7 05/10/2024 1318   MCH 25.9 (L) 05/10/2024 1318   MCHC 31.0 (L) 05/10/2024 1318   RDW 13.6 05/10/2024 1318    Hgb A1C Lab Results  Component Value Date  HGBA1C 6.0 (H) 05/10/2024           Assessment & Plan:  Assessment and Plan    Enlarged right cervical lymph nodes Enlarged nodes likely reactive lymphadenopathy due to allergies or recent abscessed tooth. Lymphoma less likely. Discussed worst-case scenario and further evaluation options. - Order blood counts to check for infection or abnormal white blood cell count. - Perform mono spot test. - Administer 80 mg Depo-Medrol  IM to see if this reduces lymph node size. - Discuss potential need for ultrasound or CT of head and neck based on lab results.  Chronic kidney disease, stage IIIa Chronic kidney disease with slightly low GFR. Reduced ibuprofen intake due to kidney function concerns. Plan to monitor GFR, especially before surgery. - Repeat kidney function tests to monitor GFR.  Abscessed tooth Recently finished course of azithromycin  - Advised her to follow-up with her dentist if symptoms persist or worsen  RTC in 4 months, follow-up chronic conditions Angeline Laura, NP

## 2024-07-14 ENCOUNTER — Ambulatory Visit: Payer: Self-pay | Admitting: Internal Medicine

## 2024-07-16 ENCOUNTER — Ambulatory Visit: Admitting: Internal Medicine

## 2024-07-16 LAB — CBC WITH DIFFERENTIAL/PLATELET
Absolute Lymphocytes: 2284 {cells}/uL (ref 850–3900)
Absolute Monocytes: 429 {cells}/uL (ref 200–950)
Basophils Absolute: 21 {cells}/uL (ref 0–200)
Basophils Relative: 0.4 %
Eosinophils Absolute: 42 {cells}/uL (ref 15–500)
Eosinophils Relative: 0.8 %
HCT: 37.5 % (ref 35.0–45.0)
Hemoglobin: 11.8 g/dL (ref 11.7–15.5)
MCH: 26.3 pg — ABNORMAL LOW (ref 27.0–33.0)
MCHC: 31.5 g/dL — ABNORMAL LOW (ref 32.0–36.0)
MCV: 83.5 fL (ref 80.0–100.0)
MPV: 10.1 fL (ref 7.5–12.5)
Monocytes Relative: 8.1 %
Neutro Abs: 2523 {cells}/uL (ref 1500–7800)
Neutrophils Relative %: 47.6 %
Platelets: 382 Thousand/uL (ref 140–400)
RBC: 4.49 Million/uL (ref 3.80–5.10)
RDW: 13.1 % (ref 11.0–15.0)
Total Lymphocyte: 43.1 %
WBC: 5.3 Thousand/uL (ref 3.8–10.8)

## 2024-07-16 LAB — BASIC METABOLIC PANEL WITH GFR
BUN: 14 mg/dL (ref 7–25)
CO2: 26 mmol/L (ref 20–32)
Calcium: 9.5 mg/dL (ref 8.6–10.4)
Chloride: 105 mmol/L (ref 98–110)
Creat: 1.02 mg/dL (ref 0.50–1.03)
Glucose, Bld: 83 mg/dL (ref 65–99)
Potassium: 4.2 mmol/L (ref 3.5–5.3)
Sodium: 139 mmol/L (ref 135–146)
eGFR: 64 mL/min/1.73m2 (ref >=60)

## 2024-07-16 LAB — MONONUCLEOSIS SCREEN: Heterophile, Mono Screen: NEGATIVE

## 2024-07-28 ENCOUNTER — Other Ambulatory Visit: Payer: Self-pay | Admitting: Internal Medicine

## 2024-07-29 NOTE — Telephone Encounter (Signed)
 Requested medication (s) are due for refill today: yes  Requested medication (s) are on the active medication list: yes  Last refill:  07/01/24  Future visit scheduled: {Yes  Notes to clinic:  Unable to refill per protocol, cannot delegate.      Requested Prescriptions  Pending Prescriptions Disp Refills   zolpidem  (AMBIEN ) 10 MG tablet [Pharmacy Med Name: ZOLPIDEM  TARTRATE 10 MG TABLET] 30 tablet 0    Sig: TAKE 1 TABLET BY MOUTH AT BEDTIME AS NEEDED FOR SLEEP.     Not Delegated - Psychiatry:  Anxiolytics/Hypnotics Failed - 07/29/2024  3:40 PM      Failed - This refill cannot be delegated      Failed - Urine Drug Screen completed in last 360 days      Passed - Valid encounter within last 6 months    Recent Outpatient Visits           2 weeks ago Cervical adenopathy   [facility] [place], Angeline ORN, NP   2 months ago Encounter for general adult medical examination with abnormal findings   [place] [facility] [address] [place], Angeline ORN, NP       Future Appointments             In 3 weeks Antonette, Angeline ORN, NP [place] [facility] [place]

## 2024-08-25 ENCOUNTER — Encounter: Payer: Self-pay | Admitting: Internal Medicine

## 2024-08-25 ENCOUNTER — Ambulatory Visit (INDEPENDENT_AMBULATORY_CARE_PROVIDER_SITE_OTHER): Admitting: Internal Medicine

## 2024-08-25 VITALS — BP 112/72 | HR 83 | Ht 63.0 in | Wt 174.0 lb

## 2024-08-25 DIAGNOSIS — E66811 Obesity, class 1: Secondary | ICD-10-CM | POA: Diagnosis not present

## 2024-08-25 DIAGNOSIS — G8929 Other chronic pain: Secondary | ICD-10-CM

## 2024-08-25 DIAGNOSIS — R7303 Prediabetes: Secondary | ICD-10-CM | POA: Diagnosis not present

## 2024-08-25 DIAGNOSIS — M25561 Pain in right knee: Secondary | ICD-10-CM | POA: Diagnosis not present

## 2024-08-25 DIAGNOSIS — Z23 Encounter for immunization: Secondary | ICD-10-CM | POA: Diagnosis not present

## 2024-08-25 DIAGNOSIS — E6609 Other obesity due to excess calories: Secondary | ICD-10-CM

## 2024-08-25 DIAGNOSIS — Z6830 Body mass index (BMI) 30.0-30.9, adult: Secondary | ICD-10-CM

## 2024-08-25 DIAGNOSIS — Z01818 Encounter for other preprocedural examination: Secondary | ICD-10-CM

## 2024-08-25 NOTE — Progress Notes (Signed)
 Subjective:    Patient ID: Amy Grimes, female    DOB: [birth year], 58 y.o.   MRN: [id number]  HPI  Discussed the use of AI scribe software for clinical note transcription with the patient, who gave verbal consent to proceed.  Amy Grimes is a 58 year old female who presents for pre-operative clearance for right knee replacement surgery.  This is to occur on October 22 by Dr. Hiram.  She has experienced right knee pain for a couple of years and has lost weight from 230 pounds to 174 pounds during this time. The pain is localized primarily to the area of swelling on the right knee. No numbness or tingling in the leg.  She occasionally takes Tylenol as needed for pain.  Review of Systems     Past Medical History:  Diagnosis Date   Allergy    Arthritis    knees   Hemorrhoids    Hyperlipidemia     Current Outpatient Medications  Medication Sig Dispense Refill   atorvastatin  (LIPITOR) 20 MG tablet TAKE 1 TABLET BY MOUTH EVERY DAY 90 tablet 3   ibuprofen (ADVIL) 800 MG tablet Take 800 mg by mouth 3 (three) times daily. (Patient taking differently: Take 800 mg by mouth as needed.)     Levocetirizine Dihydrochloride (XYZAL PO) Take by mouth daily.     Semaglutide -Weight Management (WEGOVY ) 2.4 MG/0.75ML SOAJ Inject 2.4 mg into the skin once a week.     zolpidem  (AMBIEN ) 10 MG tablet TAKE 1 TABLET BY MOUTH AT BEDTIME AS NEEDED FOR SLEEP. 30 tablet 0   No current facility-administered medications for this visit.    Allergies  Allergen Reactions   Biaxin [Clarithromycin] Other (See Comments)    Severe abdominal cramping   Penicillins Hives    Family History  Problem Relation Age of Onset   Arthritis Mother    Hyperlipidemia Mother    Hypertension Mother    Heart disease Father    Breast cancer Paternal Aunt    Colon cancer Neg Hx    Colon polyps Neg Hx    Esophageal cancer Neg Hx    Rectal cancer Neg Hx    Stomach cancer Neg Hx     Social History    Socioeconomic History   Marital status: Married    Spouse name: Not on file   Number of children: Not on file   Years of education: Not on file   Highest education level: Not on file  Occupational History   Occupation: Psychologist, sport and exercise: [employer]  Tobacco Use   Smoking status: Never   Smokeless tobacco: Never  Substance and Sexual Activity   Alcohol use: Yes    Alcohol/week: 0.0 standard drinks of alcohol    Comment: occasional   Drug use: No   Sexual activity: Yes    Birth control/protection: Surgical  Other Topics Concern   Not on file  Social History Narrative   Not on file   Social Drivers of Health   Financial Resource Strain: Not on file  Food Insecurity: Not on file  Transportation Needs: Not on file  Physical Activity: Not on file  Stress: Not on file  Social Connections: Not on file  Intimate Partner Violence: Not on file     Constitutional: Denies fever, malaise, fatigue, headache or abrupt weight changes.  HEENT: Denies eye pain, eye redness, ear pain, ringing in the ears, wax buildup, runny nose, nasal congestion, bloody nose, or sore throat. Respiratory: Patient  reports cough.  Denies difficulty breathing, shortness of breath, or sputum production.   Cardiovascular: Denies chest pain, chest tightness, palpitations or swelling in the hands or feet.  Gastrointestinal: Denies abdominal pain, bloating, constipation, diarrhea or blood in the stool.  GU: Denies urgency, frequency, pain with urination, burning sensation, blood in urine, odor or discharge. Musculoskeletal: Pt reports right knee pain. Denies decrease in range of motion, difficulty with gait, muscle pain or joint swelling.  Skin: Denies redness, rashes, or ulcercations.  Neurological: Patient reports insomnia.  Denies dizziness, difficulty with memory, difficulty with speech or problems with balance and coordination.  Psych: Denies anxiety, depression, SI/HI.  No other specific complaints  in a complete review of systems (except as listed in HPI above).  Objective:   Physical Exam BP 112/72 (BP Location: Left Arm, Patient Position: Sitting, Cuff Size: Normal)   Pulse 83   Ht 5' 3 (1.6 m)   Wt 174 lb (78.9 kg)   LMP 10/30/2018   SpO2 99%   BMI 30.82 kg/m     Wt Readings from Last 3 Encounters:  07/13/24 171 lb 3.2 oz (77.7 kg)  05/10/24 176 lb 6.4 oz (80 kg)  08/26/23 188 lb (85.3 kg)    General: Appears her stated age, obese, in NAD. Skin: Warm, dry and intact.  HEENT: Head: normal shape and size; Eyes: sclera white, no icterus, conjunctiva pink, PERRLA and EOMs intact;  Cardiovascular: Normal rate and rhythm. S1,S2 noted.  No murmur, rubs or gallops noted.  Varicose veins noted to BLE. Pulmonary/Chest: Normal effort and positive vesicular breath sounds. No respiratory distress. No wheezes, rales or ronchi noted.  Musculoskeletal: Normal flexion extension of the right knee.  Swelling noted medially of the right knee.  Pain with palpation of the right medial joint line.  Strength 5/5 BLE.  No difficulty with gait.  Neurological: Alert and oriented.    BMET    Component Value Date/Time   NA 139 07/13/2024 1357   K 4.2 07/13/2024 1357   CL 105 07/13/2024 1357   CO2 26 07/13/2024 1357   GLUCOSE 83 07/13/2024 1357   BUN 14 07/13/2024 1357   CREATININE 1.02 07/13/2024 1357   CALCIUM  9.5 07/13/2024 1357    Lipid Panel     Component Value Date/Time   CHOL 160 05/10/2024 1318   TRIG 54 05/10/2024 1318   HDL 56 05/10/2024 1318   CHOLHDL 2.9 05/10/2024 1318   VLDL 14.6 12/16/2016 1522   LDLCALC 90 05/10/2024 1318    CBC    Component Value Date/Time   WBC 5.3 07/13/2024 1357   RBC 4.49 07/13/2024 1357   HGB 11.8 07/13/2024 1357   HCT 37.5 07/13/2024 1357   PLT 382 07/13/2024 1357   MCV 83.5 07/13/2024 1357   MCH 26.3 (L) 07/13/2024 1357   MCHC 31.5 (L) 07/13/2024 1357   RDW 13.1 07/13/2024 1357   EOSABS 42 07/13/2024 1357   BASOSABS 21 07/13/2024  1357    Hgb A1C Lab Results  Component Value Date   HGBA1C 6.0 (H) 05/10/2024           Assessment & Plan:   Assessment and Plan    Preoperative Evaluation for Right Total Knee Arthroplasty for Osteoarthritis of the Right Knee. Preoperative evaluation for right total knee arthroplasty scheduled for October 22nd.  General anesthesia chosen. -Indication for ECG: Preoperative clearance -Interpretation of ECG: Normal rate and rhythm, no acute findings -Comparison of ECG: None - Order blood work including CBC, kidney function  tests, metabolic panel, PT, INR. - Order urine test to rule out UTI.    RTC in 2 months, follow-up chronic conditions Angeline Laura, NP

## 2024-08-25 NOTE — Patient Instructions (Signed)
Preparing for Knee Replacement Preparing for your knee replacement surgery can make your recovery easier and more comfortable. Talk with your health care provider so you can learn what will happen before, during, and after surgery. Ask questions if there's something you don't understand. Tell a health care provider about: Any allergies you have. All medicines you're taking, including vitamins, herbs, eye drops, creams, and over-the-counter medicines. Any problems you or family members have had with anesthesia. Any bleeding problems you have. Any surgeries you've had. Any medical conditions you have. Whether you're pregnant or may be pregnant. What happens before the procedure? Visit your providers You'll need to have a physical exam. When you go to the exam, bring a list of all the medicines and supplements you take. This includes herbs and vitamins. You may need more tests to check if it's safe for you to have surgery. Visit your dentist. Get your teeth cleaned and checked before the surgery. Germs from your mouth or an open wound can travel to your new joint and infect it. Tell your dentist that you plan to have a knee replacement. Keep all appointments. Know the costs of surgery To find out about the costs, call your insurance provider as soon as you decide to have surgery. Ask questions like: How much of the surgery and hospital stay will be covered? What will be covered for: Medical device? Physical therapy? Care at home? Prepare your home Pick a recovery spot that's not your bed. It's good to sit up a lot while you're getting better. A reclining chair might be a good spot. Put things that you often use on a small table near your spot. These may include the TV remote, a cordless phone or your mobile phone, a book, a laptop, and a water glass. Put the things you need on shelves and in drawers that are as high as your kitchen counter. Do this in your kitchen, bathroom, and bedroom.  This way, everything you need will be easy to reach. You may be given a walker to use at home. Check if you have enough room to use it. Try walking around your home with your hands out about 6 inches (15 cm) from your sides. If you don't hit anything while doing this, then you have enough room. Try walking from: Your spot to your kitchen and bathroom. Your bed to the bathroom. Prepare some meals to freeze and reheat later. Make your home safe for recovery     To prevent tripping, clear your floors. Pick up any clutter and throw rugs. Consider getting: Grab bars to put in the shower and near the toilet. A raised toilet seat. This will help you get on and off the toilet more easily. A tub or shower bench. Prepare your body If you smoke, quit as soon as you can. If there's time, it is best to quit several months before surgery. Tell your provider if you use any products that contain nicotine or tobacco. These products include cigarettes, chewing tobacco, and vaping devices, such as e-cigarettes. These can delay healing after surgery. If you need help quitting, ask your provider. Talk to your provider about doing exercises before your surgery. Doing these exercises in the weeks before your surgery may help lessen pain and improve movement in your knee after surgery. Do the exercises given by your provider. Eat a healthy diet. Do not change your diet unless your provider tells you to do that. Do not drink any alcohol for at least 48 hours  before surgery. Plan your recovery In the first few weeks after surgery, doing your usual activities might be tough. You may get tired quickly, and you won't be able to move your leg as much. To make sure you have all the help you need after your surgery: Plan to have a responsible adult take you home from the hospital. You will not be allowed to drive. Your provider will tell you how many days you can expect to be in the hospital. Do not do your normal  tasks and duties for at least 4-6 weeks after surgery. This includes work, caring for others, and volunteering. Plan to have a responsible adult stay with you day and night for the first week. This person should be someone you're comfortable with. You may need this person to help you with your exercises and with personal care, such as bathing and using the toilet. If you live alone, arrange for someone to take care of your home and pets for the first 4-6 weeks after surgery. Arrange for drivers to take you to follow-up visits, the grocery store, and other places you may need to go for at least 4-6 weeks. Consider applying for a disability parking permit. To get an application, call your local department of motor vehicles (DMV) or your provider's office. This information is not intended to replace advice given to you by your health care provider. Make sure you discuss any questions you have with your health care provider. Document Revised: 02/14/2023 Document Reviewed: 02/14/2023 Elsevier Patient Education  2024 [employer].

## 2024-08-25 NOTE — Assessment & Plan Note (Signed)
 Encouraged diet and exercise for weight loss ?

## 2024-08-26 ENCOUNTER — Ambulatory Visit: Payer: Self-pay | Admitting: Internal Medicine

## 2024-08-26 ENCOUNTER — Other Ambulatory Visit: Payer: Self-pay | Admitting: Internal Medicine

## 2024-08-26 LAB — COMPREHENSIVE METABOLIC PANEL WITH GFR
AG Ratio: 1.4 (calc) (ref 1.0–2.5)
ALT: 16 U/L (ref 6–29)
AST: 18 U/L (ref 10–35)
Albumin: 3.9 g/dL (ref 3.6–5.1)
Alkaline phosphatase (APISO): 78 U/L (ref 37–153)
BUN: 10 mg/dL (ref 7–25)
CO2: 27 mmol/L (ref 20–32)
Calcium: 9.2 mg/dL (ref 8.6–10.4)
Chloride: 106 mmol/L (ref 98–110)
Creat: 0.97 mg/dL (ref 0.50–1.03)
Globulin: 2.7 g/dL (ref 1.9–3.7)
Glucose, Bld: 83 mg/dL (ref 65–99)
Potassium: 3.9 mmol/L (ref 3.5–5.3)
Sodium: 139 mmol/L (ref 135–146)
Total Bilirubin: 0.4 mg/dL (ref 0.2–1.2)
Total Protein: 6.6 g/dL (ref 6.1–8.1)
eGFR: 68 mL/min/1.73m2 (ref >=60)

## 2024-08-26 LAB — CBC
HCT: 35.7 % (ref 35.0–45.0)
Hemoglobin: 11.4 g/dL — ABNORMAL LOW (ref 11.7–15.5)
MCH: 26.4 pg — ABNORMAL LOW (ref 27.0–33.0)
MCHC: 31.9 g/dL — ABNORMAL LOW (ref 32.0–36.0)
MCV: 82.6 fL (ref 80.0–100.0)
MPV: 10 fL (ref 7.5–12.5)
Platelets: 397 Thousand/uL (ref 140–400)
RBC: 4.32 Million/uL (ref 3.80–5.10)
RDW: 13.3 % (ref 11.0–15.0)
WBC: 4.6 Thousand/uL (ref 3.8–10.8)

## 2024-08-26 LAB — URINALYSIS, ROUTINE W REFLEX MICROSCOPIC
Bilirubin Urine: NEGATIVE
Glucose, UA: NEGATIVE
Hgb urine dipstick: NEGATIVE
Ketones, ur: NEGATIVE
Leukocytes,Ua: NEGATIVE
Nitrite: NEGATIVE
Protein, ur: NEGATIVE
Specific Gravity, Urine: 1.003 (ref 1.001–1.035)
pH: 7.5 (ref 5.0–8.0)

## 2024-08-26 LAB — PROTIME-INR
INR: 1
Prothrombin Time: 10.3 s (ref 9.0–11.5)

## 2024-08-26 LAB — HEMOGLOBIN A1C
Hgb A1c MFr Bld: 5.8 % — ABNORMAL HIGH (ref <5.7)
Mean Plasma Glucose: 120 mg/dL
eAG (mmol/L): 6.6 mmol/L

## 2024-08-30 NOTE — Telephone Encounter (Signed)
 Requested medication (s) are due for refill today: Yes  Requested medication (s) are on the active medication list: Yes  Last refill:  07/30/24  Future visit scheduled: Yes  Notes to clinic:  Not delegated.    Requested Prescriptions  Pending Prescriptions Disp Refills   zolpidem  (AMBIEN ) 10 MG tablet [Pharmacy Med Name: ZOLPIDEM  TARTRATE 10 MG TABLET] 30 tablet 0    Sig: TAKE 1 TABLET BY MOUTH EVERY DAY AT BEDTIME AS NEEDED FOR SLEEP     Not Delegated - Psychiatry:  Anxiolytics/Hypnotics Failed - 08/30/2024  1:45 PM      Failed - This refill cannot be delegated      Failed - Urine Drug Screen completed in last 360 days      Passed - Valid encounter within last 6 months    Recent Outpatient Visits           5 days ago Chronic pain of right knee   [facility] [place], Angeline ORN, NP   1 month ago Cervical adenopathy   [facility] [place], Angeline ORN, NP   3 months ago Encounter for general adult medical examination with abnormal findings   [facility] [place], Angeline ORN, NP

## 2024-10-30 ENCOUNTER — Other Ambulatory Visit: Payer: Self-pay | Admitting: Internal Medicine

## 2024-11-02 NOTE — Telephone Encounter (Signed)
 Requested medication (s) are due for refill today: yes  Requested medication (s) are on the active medication list: yes  Last refill:  08/30/24  Future visit scheduled: yes  Notes to clinic:  Unable to refill per protocol, cannot delegate.      Requested Prescriptions  Pending Prescriptions Disp Refills   zolpidem  (AMBIEN ) 10 MG tablet [Pharmacy Med Name: ZOLPIDEM  TARTRATE 10 MG TABLET] 30 tablet 0    Sig: TAKE 1 TABLET BY MOUTH AT BEDTIME AS NEEDED FOR SLEEP     Not Delegated - Psychiatry:  Anxiolytics/Hypnotics Failed - 11/02/2024  2:51 PM      Failed - This refill cannot be delegated      Failed - Urine Drug Screen completed in last 360 days      Passed - Valid encounter within last 6 months    Recent Outpatient Visits           2 months ago Chronic pain of right knee   [facility] [place], Angeline ORN, NP   3 months ago Cervical adenopathy   [facility] [place], Angeline ORN, NP   5 months ago Encounter for general adult medical examination with abnormal findings   [facility] [place], Angeline ORN, NP

## 2024-11-08 NOTE — Progress Notes (Unsigned)
 "  Subjective:    Patient ID: Amy Grimes, female    DOB: 09-Apr-1966, 58 y.o.   MRN: [id number]  HPI  Patient presents to clinic today for 65-month follow-up of chronic conditions.  Insomnia: She has difficulty falling asleep.  She takes zolpidem  as needed with good relief of symptoms.  There is no sleep study on file.  HLD: Her last LDL was 90, triglycerides 54, 04/2024.  She denies myalgias on atorvastatin .  She tries to consume a low-fat diet.  Prediabetes: Her last A1c was 6%, 04/2024.  She is not taking any oral diabetic medication at this time.  She does not check her sugars.  CKD 3: Her last creatinine was 0.97, GFR 68, 08/2024.  She is not currently on an ACEI/ARB.  She does not follow with nephrology.   Review of Systems     Past Medical History:  Diagnosis Date   Allergy    Arthritis    knees   Hemorrhoids    Hyperlipidemia     Current Outpatient Medications  Medication Sig Dispense Refill   atorvastatin  (LIPITOR) 20 MG tablet TAKE 1 TABLET BY MOUTH EVERY DAY 90 tablet 3   ibuprofen (ADVIL) 800 MG tablet Take 800 mg by mouth 3 (three) times daily.     Levocetirizine Dihydrochloride (XYZAL PO) Take by mouth daily.     Semaglutide -Weight Management (WEGOVY ) 2.4 MG/0.75ML SOAJ Inject 2.4 mg into the skin once a week. (Patient not taking: Reported on 08/25/2024)     zolpidem  (AMBIEN ) 10 MG tablet TAKE 1 TABLET BY MOUTH AT BEDTIME AS NEEDED FOR SLEEP 30 tablet 0   No current facility-administered medications for this visit.    Allergies  Allergen Reactions   Biaxin [Clarithromycin] Other (See Comments)    Severe abdominal cramping   Penicillins Hives    Family History  Problem Relation Age of Onset   Arthritis Mother    Hyperlipidemia Mother    Hypertension Mother    Heart disease Father    Breast cancer Paternal Aunt    Colon cancer Neg Hx    Colon polyps Neg Hx    Esophageal cancer Neg Hx    Rectal cancer Neg Hx    Stomach cancer Neg Hx     Social  History   Socioeconomic History   Marital status: Married    Spouse name: Not on file   Number of children: Not on file   Years of education: Not on file   Highest education level: Not on file  Occupational History   Occupation: Psychologist, Sport And Exercise: [employer]  Tobacco Use   Smoking status: Never   Smokeless tobacco: Never  Substance and Sexual Activity   Alcohol use: Yes    Alcohol/week: 0.0 standard drinks of alcohol    Comment: occasional   Drug use: No   Sexual activity: Yes    Birth control/protection: Surgical  Other Topics Concern   Not on file  Social History Narrative   Not on file   Social Drivers of Health   Tobacco Use: Low Risk (08/25/2024)   Patient History    Smoking Tobacco Use: Never    Smokeless Tobacco Use: Never    Passive Exposure: Not on file  Financial Resource Strain: Not on file  Food Insecurity: Not on file  Transportation Needs: Not on file  Physical Activity: Not on file  Stress: Not on file  Social Connections: Not on file  Intimate Partner Violence: Not on file  Depression (PHQ2-9): Low Risk (07/13/2024)   Depression (PHQ2-9)    PHQ-2 Score: 1  Alcohol Screen: Low Risk (03/17/2023)   Alcohol Screen    Last Alcohol Screening Score (AUDIT): 2  Housing: Not on file  Utilities: Not on file  Health Literacy: Not on file     Constitutional: Denies fever, malaise, fatigue, headache or abrupt weight changes.  HEENT: Denies eye pain, eye redness, ear pain, ringing in the ears, wax buildup, runny nose, nasal congestion, bloody nose, or sore throat. Respiratory: Denies difficulty breathing, shortness of breath, cough or sputum production.   Cardiovascular: Denies chest pain, chest tightness, palpitations or swelling in the hands or feet.  Gastrointestinal: Denies abdominal pain, bloating, constipation, diarrhea or blood in the stool.  GU: Denies urgency, frequency, pain with urination, burning sensation, blood in urine, odor or  discharge. Musculoskeletal:  Denies difficulty with gait, muscle pain or joint pain or swelling.  Skin: Denies redness, rashes, lesions or ulcercations.  Neurological: Patient reports insomnia.  Denies dizziness, difficulty with memory, difficulty with speech or problems with balance and coordination.  Psych: Denies anxiety, depression, SI/HI.  No other specific complaints in a complete review of systems (except as listed in HPI above).  Objective:   Physical Exam  LMP 10/30/2018   Wt Readings from Last 3 Encounters:  08/25/24 174 lb (78.9 kg)  07/13/24 171 lb 3.2 oz (77.7 kg)  05/10/24 176 lb 6.4 oz (80 kg)    General: Appears her stated age, obese, in NAD. Skin: Warm, dry and intact.  HEENT: Head: normal shape and size; Eyes: sclera white, no icterus, conjunctiva pink, PERRLA and EOMs intact;  Cardiovascular: Normal rate and rhythm. S1,S2 noted.  No murmur, rubs or gallops noted. No JVD or BLE edema. No carotid bruits noted. Pulmonary/Chest: Normal effort and positive vesicular breath sounds. No respiratory distress. No wheezes, rales or ronchi noted.  Musculoskeletal: Decreased flexion, rotation and lateral bending to the left of the cervical spine.  No bony tenderness noted over the cervical spine.  Pain with palpation of bilateral paracervical muscles.  Shoulder shrug equal.  Normal internal and external rotation of the right shoulder.  No difficulty with gait.  Neurological: Alert and oriented.  Coordination normal.    BMET    Component Value Date/Time   NA 139 08/25/2024 1107   K 3.9 08/25/2024 1107   CL 106 08/25/2024 1107   CO2 27 08/25/2024 1107   GLUCOSE 83 08/25/2024 1107   BUN 10 08/25/2024 1107   CREATININE 0.97 08/25/2024 1107   CALCIUM  9.2 08/25/2024 1107    Lipid Panel     Component Value Date/Time   CHOL 160 05/10/2024 1318   TRIG 54 05/10/2024 1318   HDL 56 05/10/2024 1318   CHOLHDL 2.9 05/10/2024 1318   VLDL 14.6 12/16/2016 1522   LDLCALC 90  05/10/2024 1318    CBC    Component Value Date/Time   WBC 4.6 08/25/2024 1107   RBC 4.32 08/25/2024 1107   HGB 11.4 (L) 08/25/2024 1107   HCT 35.7 08/25/2024 1107   PLT 397 08/25/2024 1107   MCV 82.6 08/25/2024 1107   MCH 26.4 (L) 08/25/2024 1107   MCHC 31.9 (L) 08/25/2024 1107   RDW 13.3 08/25/2024 1107   EOSABS 42 07/13/2024 1357   BASOSABS 21 07/13/2024 1357    Hgb A1C Lab Results  Component Value Date   HGBA1C 5.8 (H) 08/25/2024            Assessment & Plan:  RTC in 6 months for your annual exam Angeline Laura, NP   "

## 2024-11-09 ENCOUNTER — Encounter: Payer: Self-pay | Admitting: Internal Medicine

## 2024-11-09 ENCOUNTER — Ambulatory Visit: Admitting: Internal Medicine

## 2024-11-09 VITALS — BP 108/78 | HR 94 | Ht 63.0 in | Wt 175.2 lb

## 2024-11-09 DIAGNOSIS — N189 Chronic kidney disease, unspecified: Secondary | ICD-10-CM | POA: Insufficient documentation

## 2024-11-09 DIAGNOSIS — R7303 Prediabetes: Secondary | ICD-10-CM | POA: Diagnosis not present

## 2024-11-09 DIAGNOSIS — E6609 Other obesity due to excess calories: Secondary | ICD-10-CM

## 2024-11-09 DIAGNOSIS — E7800 Pure hypercholesterolemia, unspecified: Secondary | ICD-10-CM

## 2024-11-09 DIAGNOSIS — N1831 Chronic kidney disease, stage 3a: Secondary | ICD-10-CM | POA: Diagnosis not present

## 2024-11-09 DIAGNOSIS — M1711 Unilateral primary osteoarthritis, right knee: Secondary | ICD-10-CM

## 2024-11-09 DIAGNOSIS — E66811 Obesity, class 1: Secondary | ICD-10-CM

## 2024-11-09 DIAGNOSIS — M179 Osteoarthritis of knee, unspecified: Secondary | ICD-10-CM | POA: Insufficient documentation

## 2024-11-09 DIAGNOSIS — Z6831 Body mass index (BMI) 31.0-31.9, adult: Secondary | ICD-10-CM

## 2024-11-09 DIAGNOSIS — F5101 Primary insomnia: Secondary | ICD-10-CM | POA: Diagnosis not present

## 2024-11-09 MED ORDER — ATORVASTATIN CALCIUM 20 MG PO TABS: 20.0000 mg | ORAL_TABLET | Freq: Every day | ORAL | 1 refills | Status: AC

## 2024-11-09 NOTE — Assessment & Plan Note (Signed)
 Encouraged diet and exercise for weight loss ?

## 2024-11-09 NOTE — Assessment & Plan Note (Signed)
 Continue zolpidem  10 mg nightly as needed Will monitor

## 2024-11-09 NOTE — Assessment & Plan Note (Signed)
 Complicated by obesity A1c reviewed Encourage low-carb diet and exercise for weight loss

## 2024-11-09 NOTE — Assessment & Plan Note (Signed)
 NSAID induced Resolved after stopping ibuprofen OTC Will monitor kidney function

## 2024-11-09 NOTE — Assessment & Plan Note (Signed)
 Complicated by obesity Encouraged low-fat diet Continue atorvastatin  20 mg daily Encouraged her to consume a low-fat diet

## 2024-11-09 NOTE — Assessment & Plan Note (Signed)
 Complicated by obesity Encouraged weight loss as this can help reduce joint pain Continue physical therapy Continue tramadol 50 mg every 6 hours as needed and methocarbamol  500 mg every 6 hours as needed She will continue to follow-up with orthopedics

## 2024-11-09 NOTE — Patient Instructions (Signed)

## 2024-12-01 ENCOUNTER — Other Ambulatory Visit: Payer: Self-pay | Admitting: Internal Medicine

## 2024-12-02 NOTE — Telephone Encounter (Signed)
 Requested medication (s) are due for refill today: yes  Requested medication (s) are on the active medication list: yes  Last refill:  11/02/24  Future visit scheduled: {Yes  Notes to clinic:  Unable to refill per protocol, cannot delegate.      Requested Prescriptions  Pending Prescriptions Disp Refills   zolpidem  (AMBIEN ) 10 MG tablet [Pharmacy Med Name: ZOLPIDEM  TARTRATE 10 MG TABLET] 30 tablet 0    Sig: TAKE 1 TABLET BY MOUTH EVERY DAY AT BEDTIME AS NEEDED FOR SLEEP     Not Delegated - Psychiatry:  Anxiolytics/Hypnotics Failed - 12/02/2024 12:19 PM      Failed - This refill cannot be delegated      Failed - Urine Drug Screen completed in last 360 days      Passed - Valid encounter within last 6 months    Recent Outpatient Visits           3 weeks ago Primary insomnia   [facility] [place], Angeline ORN, NP   3 months ago Chronic pain of right knee   [facility] [place], Angeline ORN, NP   4 months ago Cervical adenopathy   [place] [facility] [place], Angeline ORN, NP   6 months ago Encounter for general adult medical examination with abnormal findings    [facility] [place], Angeline ORN, NP

## 2025-05-12 ENCOUNTER — Encounter: Admitting: Internal Medicine
